# Patient Record
Sex: Female | Born: 1965 | Race: Black or African American | Hispanic: No | Marital: Married | Smoking: Never smoker
Health system: Southern US, Community
[De-identification: ages and names within clinical notes are randomized; demographics above are authoritative.]

## PROBLEM LIST (undated history)

## (undated) DIAGNOSIS — I4892 Unspecified atrial flutter: Secondary | ICD-10-CM

## (undated) DIAGNOSIS — E7800 Pure hypercholesterolemia, unspecified: Secondary | ICD-10-CM

## (undated) DIAGNOSIS — F419 Anxiety disorder, unspecified: Secondary | ICD-10-CM

## (undated) HISTORY — DX: Anxiety disorder, unspecified: F41.9

## (undated) HISTORY — PX: TUBAL LIGATION: SHX77

---

## 1997-08-13 ENCOUNTER — Observation Stay (HOSPITAL_COMMUNITY): Admission: RE | Admit: 1997-08-13 | Discharge: 1997-08-14 | Payer: Self-pay | Admitting: *Deleted

## 1997-10-29 ENCOUNTER — Inpatient Hospital Stay (HOSPITAL_COMMUNITY): Admission: AD | Admit: 1997-10-29 | Discharge: 1997-10-31 | Payer: Self-pay | Admitting: *Deleted

## 1997-11-02 ENCOUNTER — Encounter (HOSPITAL_COMMUNITY): Admission: RE | Admit: 1997-11-02 | Discharge: 1997-11-25 | Payer: Self-pay | Admitting: *Deleted

## 1997-11-24 ENCOUNTER — Inpatient Hospital Stay (HOSPITAL_COMMUNITY): Admission: AD | Admit: 1997-11-24 | Discharge: 1997-11-26 | Payer: Self-pay | Admitting: *Deleted

## 1997-12-23 ENCOUNTER — Other Ambulatory Visit: Admission: RE | Admit: 1997-12-23 | Discharge: 1997-12-23 | Payer: Self-pay | Admitting: *Deleted

## 1998-01-08 ENCOUNTER — Ambulatory Visit (HOSPITAL_COMMUNITY): Admission: RE | Admit: 1998-01-08 | Discharge: 1998-01-08 | Payer: Self-pay | Admitting: *Deleted

## 1999-04-13 ENCOUNTER — Other Ambulatory Visit: Admission: RE | Admit: 1999-04-13 | Discharge: 1999-04-13 | Payer: Self-pay | Admitting: *Deleted

## 2000-08-03 ENCOUNTER — Encounter: Admission: RE | Admit: 2000-08-03 | Discharge: 2000-08-03 | Payer: Self-pay

## 2000-08-07 ENCOUNTER — Other Ambulatory Visit: Admission: RE | Admit: 2000-08-07 | Discharge: 2000-08-07 | Payer: Self-pay | Admitting: *Deleted

## 2000-08-10 ENCOUNTER — Ambulatory Visit (HOSPITAL_COMMUNITY): Admission: RE | Admit: 2000-08-10 | Discharge: 2000-08-10 | Payer: Self-pay

## 2000-11-13 ENCOUNTER — Emergency Department (HOSPITAL_COMMUNITY): Admission: EM | Admit: 2000-11-13 | Discharge: 2000-11-13 | Payer: Self-pay | Admitting: Emergency Medicine

## 2001-08-07 ENCOUNTER — Emergency Department (HOSPITAL_COMMUNITY): Admission: EM | Admit: 2001-08-07 | Discharge: 2001-08-07 | Payer: Self-pay | Admitting: Emergency Medicine

## 2001-08-07 ENCOUNTER — Encounter: Payer: Self-pay | Admitting: Emergency Medicine

## 2001-08-14 ENCOUNTER — Other Ambulatory Visit: Admission: RE | Admit: 2001-08-14 | Discharge: 2001-08-14 | Payer: Self-pay | Admitting: *Deleted

## 2002-03-26 ENCOUNTER — Encounter: Admission: RE | Admit: 2002-03-26 | Discharge: 2002-03-26 | Payer: Self-pay | Admitting: Internal Medicine

## 2002-03-26 ENCOUNTER — Encounter: Payer: Self-pay | Admitting: Internal Medicine

## 2002-05-05 ENCOUNTER — Emergency Department (HOSPITAL_COMMUNITY): Admission: EM | Admit: 2002-05-05 | Discharge: 2002-05-05 | Payer: Self-pay | Admitting: Emergency Medicine

## 2002-05-05 ENCOUNTER — Encounter: Payer: Self-pay | Admitting: Emergency Medicine

## 2002-05-28 ENCOUNTER — Encounter: Admission: RE | Admit: 2002-05-28 | Discharge: 2002-05-28 | Payer: Self-pay | Admitting: Gastroenterology

## 2002-05-28 ENCOUNTER — Encounter: Payer: Self-pay | Admitting: Gastroenterology

## 2002-08-18 ENCOUNTER — Other Ambulatory Visit: Admission: RE | Admit: 2002-08-18 | Discharge: 2002-08-18 | Payer: Self-pay | Admitting: *Deleted

## 2002-09-01 ENCOUNTER — Ambulatory Visit (HOSPITAL_COMMUNITY): Admission: RE | Admit: 2002-09-01 | Discharge: 2002-09-01 | Payer: Self-pay | Admitting: Gastroenterology

## 2002-10-10 ENCOUNTER — Emergency Department (HOSPITAL_COMMUNITY): Admission: EM | Admit: 2002-10-10 | Discharge: 2002-10-10 | Payer: Self-pay

## 2003-01-22 ENCOUNTER — Encounter: Payer: Self-pay | Admitting: Surgery

## 2003-01-22 ENCOUNTER — Encounter: Admission: RE | Admit: 2003-01-22 | Discharge: 2003-01-22 | Payer: Self-pay | Admitting: Surgery

## 2003-02-17 ENCOUNTER — Ambulatory Visit (HOSPITAL_COMMUNITY): Admission: RE | Admit: 2003-02-17 | Discharge: 2003-02-17 | Payer: Self-pay | Admitting: Gastroenterology

## 2004-09-01 ENCOUNTER — Encounter: Admission: RE | Admit: 2004-09-01 | Discharge: 2004-09-01 | Payer: Self-pay | Admitting: General Surgery

## 2004-11-30 ENCOUNTER — Encounter: Admission: RE | Admit: 2004-11-30 | Discharge: 2004-11-30 | Payer: Self-pay | Admitting: General Surgery

## 2006-04-17 ENCOUNTER — Encounter: Admission: RE | Admit: 2006-04-17 | Discharge: 2006-04-17 | Payer: Self-pay | Admitting: General Surgery

## 2007-02-20 ENCOUNTER — Inpatient Hospital Stay (HOSPITAL_COMMUNITY): Admission: EM | Admit: 2007-02-20 | Discharge: 2007-02-21 | Payer: Self-pay | Admitting: Emergency Medicine

## 2007-03-28 ENCOUNTER — Ambulatory Visit (HOSPITAL_COMMUNITY): Admission: RE | Admit: 2007-03-28 | Discharge: 2007-03-28 | Payer: Self-pay | Admitting: Gastroenterology

## 2007-04-23 ENCOUNTER — Encounter: Admission: RE | Admit: 2007-04-23 | Discharge: 2007-04-23 | Payer: Self-pay | Admitting: *Deleted

## 2008-05-07 ENCOUNTER — Emergency Department (HOSPITAL_COMMUNITY): Admission: EM | Admit: 2008-05-07 | Discharge: 2008-05-07 | Payer: Self-pay | Admitting: Family Medicine

## 2008-10-08 ENCOUNTER — Encounter: Admission: RE | Admit: 2008-10-08 | Discharge: 2008-10-08 | Payer: Self-pay | Admitting: Family Medicine

## 2010-06-12 ENCOUNTER — Encounter: Payer: Self-pay | Admitting: *Deleted

## 2010-06-12 ENCOUNTER — Encounter: Payer: Self-pay | Admitting: General Surgery

## 2010-06-27 ENCOUNTER — Other Ambulatory Visit: Payer: Self-pay | Admitting: Family Medicine

## 2010-06-27 DIAGNOSIS — R928 Other abnormal and inconclusive findings on diagnostic imaging of breast: Secondary | ICD-10-CM

## 2010-06-28 ENCOUNTER — Other Ambulatory Visit: Payer: Self-pay | Admitting: Family Medicine

## 2010-06-28 DIAGNOSIS — R928 Other abnormal and inconclusive findings on diagnostic imaging of breast: Secondary | ICD-10-CM

## 2010-06-30 ENCOUNTER — Other Ambulatory Visit: Payer: Self-pay

## 2010-10-04 NOTE — Op Note (Signed)
Sherry Kline, Sherry Kline            ACCOUNT NO.:  [id number]   MEDICAL RECORD NO.:  [id number]          PATIENT TYPE:  AMB   LOCATION:  ENDO                         FACILITY:  [facility]   PHYSICIAN:  Shirley Friar, MDDATE OF BIRTH:  1965/11/10   DATE OF PROCEDURE:  03/28/2007  DATE OF DISCHARGE:  03/28/2007                               OPERATIVE REPORT   INDICATIONS:  Chest pain.   FINDINGS:  The means to score acid reflux analysis.  On day #1, total  score equals 15.1 with normal being less than 14.72.  Total score on day  #2,  13.1 with normal being less than 14.72.   IMPRESSION:  Chest pain that she recorded does correlate with her pH  being less than 4.  The majority of her chest pain episodes occurred  while  her esophageal pH was in the acidic or less than for range.  The  patient needs to do lifestyle modifications for reflux as previously  discussed in the office.  She should start back on her daily proton pump  inhibitor as previously recommended.  She will follow up with me in 3  weeks.      Shirley Friar, MD  Electronically Signed     VCS/MEDQ  D:  04/02/2007  T:  04/03/2007  Job:  [id number]

## 2010-10-04 NOTE — Discharge Summary (Signed)
Sherry Kline, WILHITE NO.:  [id number]   MEDICAL RECORD NO.:  [id number]          PATIENT TYPE:  INP   LOCATION:  6529                         FACILITY:  [facility]   PHYSICIAN:  Georgann Housekeeper, MD      DATE OF BIRTH:  [birth year]   DATE OF ADMISSION:  02/20/2007  DATE OF DISCHARGE:  02/21/2007                               DISCHARGE SUMMARY   DISCHARGE DIAGNOSES:  1. Chest pain, atypical.  2. Gastroesophageal reflux disease.  3. Anxiety disorder.  4. Dyslipidemia.   DISCHARGE MEDICATIONS:  1. Protonix 40 mg daily.  2. Zocor 20 mg daily.   PROCEDURES:  Ultrasound of the abdomen negative for gallbladder disease  or gallstones.  Cardiac consultation with Dr. Donato Schultz and lab work,  cardiac markers negative.  Blood chemistry is negative.  Lipase was  mildly elevated at 117.  Echocardiogram of the heart was normal.   HOSPITAL COURSE:  A 45 year old female with history of anxiety and  dyslipidemia and history of remote peptic ulcer disease admitted with  typical chest discomfort, admitted to telemetry, ruled out for MI.  The  patient had cardiac markers negative.  Lab work was all unremarkable.  A  2D echocardiogram was done which was normal.  The patient had more  epigastric discomfort, ultrasound obtained because of her mildly  elevated lipase.  She clinically did well.  Ultrasound of the abdomen  was negative for any gallbladder disease.  As far as the patient was put  on Protonix 40 mg and follow up outpatient.  As far as her dyslipidemia  was started on Zocor 20 mg and lipid profile will be checked again as an  outpatient.  If needed consider outpatient stress test as per  cardiology.  See her back in the office in 1 to 2 weeks.      Georgann Housekeeper, MD  Electronically Signed     KH/MEDQ  D:  04/12/2007  T:  04/12/2007  Job:  [id number]

## 2010-10-04 NOTE — Consult Note (Signed)
NAMEHIKARI, TRIPP NO.:  [id number]   MEDICAL RECORD NO.:  [id number]          PATIENT TYPE:  INP   LOCATION:  1827                         FACILITY:  [facility]   PHYSICIAN:  Jake Bathe, MD      DATE OF BIRTH:  1966-03-15   DATE OF CONSULTATION:  02/20/2007  DATE OF DISCHARGE:                                 CONSULTATION   REFERRING PHYSICIAN:  Dr. Georgann Housekeeper.   REASON FOR CONSULTATION:  Ms. Sorn is being seen at the request of  Dr. Donette Larry for the evaluation of chest pain.   HISTORY OF PRESENT ILLNESS:  Ms. Westbay is a 45 year old female with no  known prior coronary artery disease, who complains of intermittent yet  progressive substernal chest pain/pressure with radiation to her left  shoulder, left arm accompanied by nausea and diaphoresis.  The pain had  started on Monday, and at its worst intensity was 5 to 6/10.  She does  have constant epigastric discomfort once intense pain had resolved.   Years ago, she had a stress test done by Dr. Katrinka Blazing secondary to panic  attacks, which was normal.  She does have a strong family history of  coronary artery disease and hyperlipidemia.  Currently is chest pain  free.  Denies any palpitations, syncope, edema, orthopnea, PND.   PAST MEDICAL HISTORY:  1. Hyperlipidemia.  2. Tubal ligation.  3. Cervical anchorage in the past.   ALLERGIES:  She has had an allergic reaction in the past to a component  of the GI cocktail; she is not sure what component.   MEDICATIONS:  None currently.   FAMILY HISTORY:  Her mother is alive, has history of rheumatic heart  disease.  Her dad has died secondary to pancreatic complications.  She  does have a strong early family history of coronary artery disease.   SOCIAL HISTORY:  Occasionally drinks alcohol in moderation, is a  nonsmoker and no illicit drug use.   REVIEW OF SYSTEMS:  Unless as specified above, all other 12 review of  systems negative.   PHYSICAL EXAM:   VITAL SIGNS:  Blood pressure 111/85, pulse 80,  respirations 20, temperature 98.1.  GENERAL:  Alert and oriented x3, in no acute distress, lying in bed  comfortably, with her family.  Echo technician here performing  echocardiogram.  HEENT:  Eyes:  Well-perfused conjunctivae, anicteric sclerae.  EOMI.  NECK:  Supple.  No lymphadenopathy, no thyromegaly.  CARDIOVASCULAR:  Regular rate and rhythm.  No murmurs, rubs, or gallops.  No JVD, no carotid bruits.  LUNGS:  Clear to auscultation bilaterally.  No wheezes, no rales.  Normal respiratory effort.  ABDOMEN:  Soft. Nontender.  Normoactive bowel sounds.  No  hepatosplenomegaly appreciated.  EXTREMITIES:  No clubbing, cyanosis, or edema.  2+ distal pulses.  NEUROLOGIC:  Nonfocal.  No tremors.  SKIN:  Warm, dry, and intact.   DATA:  ECG here demonstrates sinus rhythm, rate 83, with sinus  arrhythmia.  No other abnormalities noted.  Prior ECG dated May 05, 2002, is normal.  Chest x-ray personally reviewed shows no disease.   LABORATORY  WORK:  Sodium 138, potassium 3.7, BUN 16, creatinine 0.9,  glucose 98.  Hemoglobin 14.6, hematocrit 43, D-dimer less than 0.22.  INR 1.0, PTT 29.  Two sets of point of care cardiac enzymes have been  normal.   ASSESSMENT AND PLAN:  A 45 year old female with hyperlipidemia and  family history of coronary artery disease with chest pain concerning for  unstable angina.  1. Unstable angina - we will go ahead and place her on telemetry.      Continue to cycle cardiac enzymes.  We will utilize Lovenox b.i.d.      We will review echocardiogram already performed.  EKG reassuring.      Currently chest pain free.  Oxygen, nitroglycerin, beta blocker.      We will check fasting lipid profile.  2. Hyperlipidemia - check fasting lipid profile and liver function      tests, currently diet controlled.  3. Epigastric discomfort - we will check amylase and lipase.  Wonder      if her pain may be secondary to  gastroesophageal reflux disease or      peptic ulcer disease.  May benefit from proton pump inhibitor.  4. Rare alcohol use.  5. If cardiac enzymes are positive or if she has any other high-risk      features, we will plan for cardiac catheterization.  However, if      she continues to be chest pain free and cardiac enzymes and EKG are      normal, we will plan on nuclear stress test for further risk      stratification.      Jake Bathe, MD  Electronically Signed     MCS/MEDQ  D:  02/20/2007  T:  02/20/2007  Job:  [id number]   cc:   Georgann Housekeeper, MD

## 2010-10-04 NOTE — H&P (Signed)
NAMECHRISTAIN, MCRANEY NO.:  [id number]   MEDICAL RECORD NO.:  [id number]          PATIENT TYPE:  INP   LOCATION:  1827                         FACILITY:  [facility]   PHYSICIAN:  Ramiro Harvest, MD    DATE OF BIRTH:  Apr 10, 1966   DATE OF ADMISSION:  02/20/2007  DATE OF DISCHARGE:                              HISTORY & PHYSICAL   HISTORY OF PRESENT ILLNESS:  Sherry Kline is a 45 year old African-  American female with past medical history of hyperlipidemia, anxiety,  family history history of coronary artery disease, status post MI in the  patient's mother in her early 19s.  The patient presents to the ED with  a 3-day history of worsening substernal left-sided chest pain with  radiation to the left upper extremity, occurring both at rest and on  exertion.  The patient describes the pain as a heaviness which is  constant in nature and radiating to the right upper extremity.  Associated symptoms include diaphoresis, shortness of breath, orthopnea.  The patient denies any palpitations.  No syncope, no heartburn.  The  patient states the chest pain is now 5 out of 10.  The patient stated  that she took aspirin last night which had some relief in her symptoms  and took 1 this morning prior to coming to the ED.  The patient's chest  pain occurred again, this morning.  It has been constant in nature and  occurred while at rest, at work and while on the phone.   ALLERGIES:  QUESTIONABLE ALLERGIES TO A GI COCKTAIL.   PAST MEDICAL HISTORY:  1. Anxiety.  2. Hyperlipidemia.  3. History of ulcer, approximately 9 years ago.   PAST SURGICAL HISTORY:  Status post tubal ligation, status post cervical  collage.   MEDICATIONS:  None.   FAMILY HISTORY:  Mother alive at age 42 with a history of mitral valve  prolapse, secondary to rheumatic heart disease, status post myocardial  infarction times 2 in her early 97s, hypertension, hyperlipemia, thyroid  disease, lupus, status  post bladder cancer.  Father deceased at age 29,  secondary to pancreatic disease.  The patient has 1 living sister, 1  brother age 39, and 1 sister who is decreased from a MVA at age 14.   SOCIAL HISTORY:  The patient is divorced.  Lives in [place]; no  tobacco.  Occasional alcohol use.  No cocaine or IV drug use.   REVIEW OF SYSTEMS:  GENERAL:  No fever, no chills.  No recent travel.  No recent weight loss.  HEENT:  No sore throat.  No nose bleeds.  RESPIRATORY-WISE:  Positive shortness of breath.  No cough.  No  hemoptysis.  No wheezing.  GI:  Positive for nausea.  No vomiting.  No  diarrhea, no constipation or dysphagia.  GU:  No dysuria, no hematuria.  PSYCHOLOGICAL:  No depression.  No suicidal ideation.  No homicidal  ideation.  No anxiety and no polyuria.  No polydipsia.  NEUROLOGICAL:  No weakness, no numbness.  No headache, no diplopia.   PHYSICAL EXAMINATION:  VITAL SIGNS:  Temperature 98.4, blood pressure  137/94,  pulse of 90, respiratory rate 26, saturating 100% on 2 liters  nasal canula.  GENERAL:  The patient is well developed, well nourished  African-American female laying on gurney.  HEENT:  Normocephalic, atraumatic.  Pupils equal, round, reactive to  light.  Extraocular movements intact.  Sclerae is anicteric.  NECK:  Supple.  Oropharynx clear.  Moist.  No lesions. No exudates.  RESPIRATORY:  Lungs are clear to auscultation bilaterally.  No wheezes.  No rhonchi.  CARDIOVASCULAR:  Heart is regular rate and rhythm.  No murmurs, rubs or  gallops.  Questionable JVD.  ABDOMEN:  Soft, nontender, nondistended.  Positive bowel sounds.  No  hepatosplenomegaly.  EXTREMITIES:  No clubbing, cyanosis or edema.  NEUROLOGICAL:  The patient is alert and oriented x3.  Cranial nerves II-  XII are grossly intact.  Cerebellum was intact.  Gait not tested.   LABS:  Chest x-ray is no acute cardiopulmonary disease.  High stat has a  sodium of 138, potassium 3.8, chloride of 108, BUN  16, glucose 98.  H&H:  Hemoglobin 14.6, hematocrit 43.6.  Point of care myoglobin 48.5.  CK-MB  less than 1.  Troponin I less than 0.05.  D-dimer was less than 0.22.  EKG:  Normal sinus rhythm.  Unchanged from prior EKG.   ASSESSMENT AND PLAN:  1. Sherry Kline is a 45 year old African-American female with a      history of hyperlipidemia, positive family history of coronary      artery disease, presenting with worsening chest pain at rest and on      exertion.  Chest pain/unstable angina.  Will admit the patient to      telemetry to rule out acute coronary syndrome.  Doubt if this is      secondary to pulmonary embolism as the D-dimer was negative.  Doubt      if this is dissection. Chest x-ray was also clear with no evidence      of cardiopulmonary disease.  I am going to go ahead and cycle some      cardiac enzymes q.8 hours x3.  Check her EKG now and in 12 hours      and in the morning.  Check her C-MET, CBC, TSH, fasting lipid      panel.  Check her guaiac, PT, PTT and INR.  Check her UA and UDS.      Check a 2-D echo to evaluate LV dysfunction.  We will maintain the      patient on oxygen to keep saturations greater than 90%, Morphine 2      mg IV q.4 hours p.r.n. chest pain.  The patient already received      325 mg of aspirin this morning.  Will continue on aspirin 81 mg      daily.  Start patient on Lopressor 5 mg IV q.5 minutes x3, then      Lopressor 25 mg p.o. q.6 hours and start her on lisinopril 2.5 mg      daily and Lipitor 10 mg daily as well as Lovenox.  We will get a      cardiac consult on this patient as well.  2. Hyperlipidemia.  Will check a fasting lipid panel.  Start the      patient on Lipitor 10 mg daily.  Prophylaxis:  Protonix 40 mg p.o.      daily for GI and Lovenox for DVT prophylaxis.   It has been a pleasure taking care of Sherry Kline.  Ramiro Harvest, MD  Electronically Signed     DT/MEDQ  D:  02/20/2007  T:  02/20/2007  Job:   [id number]

## 2010-10-04 NOTE — Op Note (Signed)
Sherry Kline, Sherry Kline            ACCOUNT NO.:  [id number]   MEDICAL RECORD NO.:  [id number]          PATIENT TYPE:  AMB   LOCATION:  ENDO                         FACILITY:  [facility]   PHYSICIAN:  Shirley Friar, MDDATE OF BIRTH:  [birth year]   DATE OF PROCEDURE:  03/28/2007  DATE OF DISCHARGE:                               OPERATIVE REPORT   UPPER ENDOSCOPY AND BRAVO CAPSULE PLACEMENT:   INDICATIONS:  Chest pain.   MEDICATIONS:  Fentanyl 100 mcg IV, Versed 8 mg IV, Phenergan 25 mg IV,  Cetacaine spray x2.   FINDINGS:  The endoscope was inserted through the oropharynx and the  esophagus was intubated.  The esophagus was normal in its entirety.  The  Z-line was regular in appearance and was noted at 30-38 cm from  incisors.  Endoscope was advanced into the stomach and down into the  distal stomach.  In the distal stomach there were scattered small  erosions and a superficial ulceration in the distal antrum/prepyloric  channel.  Retroflexion was done, which revealed a normal proximal  stomach.  Endoscope was straightened and advanced to the duodenal bulb  and second portion of the duodenum, which were both normal.  Endoscope  was withdrawn back into the stomach and one biopsy was taken of the  distal antrum for sending off for CLO testing.  The endoscope was then  withdrawn.  A Bravo capsule catheter was inserted in the usual fashion  and attached at 32 cm, which was approximately 6 cm above the GE  junction.  The endoscope was reinserted to confirm adequate placement  without any immediate complications.   ASSESSMENT:  1. Gastric erosions, status post biopsy for CLO testing.  2. Status post successful Bravo capsule placement at 32 cm from the      incisors.   PLAN:  1. Follow up on CLO-test and treat for H. pylori if positive.  2. Follow up on 48-hour pH Bravo capsule testing.  The patient is      going to remain off of proton pump inhibitors for the testing.      Shirley Friar, MD  Electronically Signed     VCS/MEDQ  D:  03/28/2007  T:  03/28/2007  Job:  [id number]   cc:   Georgann Housekeeper, MD  Fax: [phone]

## 2010-10-07 NOTE — Op Note (Signed)
   Sherry Kline, Sherry Kline NO.:  [id number]   MEDICAL RECORD NO.:  [id number]                   PATIENT TYPE:  AMB   LOCATION:  ENDO                                 FACILITY:  [facility]   PHYSICIAN:  Danise Edge, M.D.                DATE OF BIRTH:  May 17, 1966   DATE OF PROCEDURE:  02/17/2003  DATE OF DISCHARGE:  02/17/2003                                 OPERATIVE REPORT   PROCEDURE:  Esophageal manometry and ambulatory 24 hour esophageal pH study.   PROCEDURE INDICATIONS:  Ms. Alane Hanssen is a 45 year old female born  [birth year].  Ms. Villagomez has unexplained chest pain unresponsive to  antireflux therapy with proton pump inhibitor medication.  She is undergoing  esophageal manometry and 24 hour esophageal pH study off proton pump  inhibitor therapy.   ENDOSCOPIST:  Danise Edge, M.D.   PROCEDURE:  Esophageal manometry.   Lower esophageal sphincter data:  The resting lower esophageal sphincter  pressure is 35 mmHg which is normal.  There is 92% relaxation of the lower  esophageal sphincter pressure with swallow for 13.1 seconds.  Lower  esophageal sphincter function is normal.   Esophageal body function:  Esophageal body function is based on 10 wet  swallows which resulted in 100% peristaltic contractions.  Peristaltic wave  amplitude averages 172 mmHg which is normal.  Esophageal body function is  normal.   Upper esophageal sphincter function:  There is 100% relaxation of the upper  esophageal sphincter pressure with swallows and upper esophageal body and  sphincter function is coordinated.   ASSESSMENT:  Normal esophageal manometry.   A 24 hour esophageal pH study:  Upright time in reflux, (3%), recumbent time  in reflux, (0.6%), and total time in reflux, (2%), are all completely  normal.  There was no episode of reflux lasting over five minutes; the  longest reflux  episode lasted 2.1 minutes.  The patient had 70 episodes of  reflux which is  slightly above the normal of less than 50 for 24 hours.  The composite  Johnson/DeMeester score is 9.7 which is well within the normal less than  22.0.   ASSESSMENT:  Normal ambulatory esophageal pH study.                                                Danise Edge, M.D.    MJ/MEDQ  D:  03/09/2003  T:  03/09/2003  Job:  [id number]   cc:   Velora Heckler, M.D.  [address] [place]  [place] [address]  Fax: [phone]

## 2010-10-07 NOTE — Op Note (Signed)
Sherry Kline, Sherry Kline NO.:  [id number]   MEDICAL RECORD NO.:  [id number]                   PATIENT TYPE:  AMB   LOCATION:  ENDO                                 FACILITY:  [facility]   PHYSICIAN:  Danise Edge, M.D.                DATE OF BIRTH:  Nov 05, 1965   DATE OF PROCEDURE:  09/01/2002  DATE OF DISCHARGE:                                 OPERATIVE REPORT   INDICATIONS FOR PROCEDURE:  The patient is a 45 year old female born  1965/05/29.  The patient has unexplained intermittent bouts of chest  pain and a globus sensation unassociated with dysphagia or odynophagia.  She  has been evaluated by the cardiologist and has no cardiac disease to explain  her bouts of chest pain clearly.  She underwent a barium swallow/upper GI x-  ray series which was normal.  She was placed on double dose protein pump  inhibitor therapy and her symptoms did not respond significantly.  She  chronically takes Paxil CR and Xanax.  The question is whether the patient  has enough gastroesophageal reflux to explain her symptoms.   ENDOSCOPIST:  Danise Edge, M.D.   PREMEDICATION:  Versed 10 mg, Demerol 50 mg.   PROCEDURE:  After obtaining informed consent the patient was placed in the  left lateral decubitus position.  I administered intravenous Demerol and  intravenous Versed to achieve conscious sedation for the  procedure.  The  patient's blood pressure, oxygen saturation and cardiac rhythm were  monitored during the procedure and documented in the medical record.   The Olympus gastroscope was passed through the posterior hypopharynx into  the proximal esophagus without difficulty.  The hypopharynx, larynx and  vocal cords appeared normal.   Esophagoscopy:  The proximal, mid and lower segments about the esophageal  mucosa appear completely normal.   Gastroscopy:  Retroflex view of the gastric cardia and fundus was normal.  The gastric body, antrum and pylorus  appeared normal.   Duodenoscopy:  The duodenal bulb, mid duodenal and distal duodenum appear  normal.   ASSESSMENT:  Normal esophagogastroduodenoscopy.    RECOMMENDATIONS:  Consider 24 hour esophageal pH study to assess  gastroesophageal reflux. I suspect the patient does not have significant  gastroesophageal reflux and reflux is not the explanation for her bouts of  chest pain and her globus sensation.                                               Danise Edge, M.D.    MJ/MEDQ  D:  09/01/2002  T:  09/01/2002  Job:  [id number]   cc:   Lesleigh Noe, M.D.  [address]  [place]  [place] [address]  Fax: [phone]   Georgann Housekeeper, M.D.  [phone]  [place]  [place] [address]  Fax: [phone]

## 2011-02-24 LAB — POCT URINALYSIS DIP (DEVICE)
Hgb urine dipstick: NEGATIVE
Protein, ur: 30 mg/dL — AB
Specific Gravity, Urine: 1.025 (ref 1.005–1.030)
Urobilinogen, UA: 0.2 mg/dL (ref 0.0–1.0)
pH: 6.5 (ref 5.0–8.0)

## 2011-02-24 LAB — COMPREHENSIVE METABOLIC PANEL
ALT: 10 U/L (ref 0–35)
Chloride: 105 mEq/L (ref 96–112)
GFR calc non Af Amer: >60 mL/min (ref >60)
Glucose, Bld: 103 mg/dL — ABNORMAL HIGH (ref 70–99)
Potassium: 3.7 mEq/L (ref 3.5–5.1)
Sodium: 138 mEq/L (ref 135–145)
Total Protein: 7.1 g/dL (ref 6.0–8.3)

## 2011-02-24 LAB — CBC
RBC: 4.6 MIL/uL (ref 3.87–5.11)
RDW: 13 % (ref 11.5–15.5)

## 2011-02-24 LAB — DIFFERENTIAL
Eosinophils Absolute: 0 10*3/uL (ref 0.0–0.7)
Monocytes Absolute: 0.4 10*3/uL (ref 0.1–1.0)
Monocytes Relative: 9 % (ref 3–12)
Neutro Abs: 2.2 10*3/uL (ref 1.7–7.7)

## 2011-02-24 LAB — POCT H PYLORI SCREEN: H. PYLORI SCREEN, POC: NEGATIVE

## 2011-02-24 LAB — POCT PREGNANCY, URINE: Preg Test, Ur: NEGATIVE

## 2011-02-28 LAB — CLOTEST (H. PYLORI), BIOPSY: Helicobacter screen: NEGATIVE

## 2011-03-02 LAB — I-STAT 8, (EC8 V) (CONVERTED LAB)
Acid-base deficit: 1
Glucose, Bld: 98
HCT: 43
Hemoglobin: 14.6
Potassium: 3.7
TCO2: 24
pCO2, Ven: 34.9 — ABNORMAL LOW
pH, Ven: 7.429 — ABNORMAL HIGH

## 2011-03-02 LAB — CBC
HCT: 35.8 — ABNORMAL LOW
HCT: 36.7
Hemoglobin: 12
MCHC: 33.5
MCV: 82.3
Platelets: 354
RBC: 4.35
RDW: 13.4
WBC: 4.3

## 2011-03-02 LAB — POCT CARDIAC MARKERS
Myoglobin, poc: 48.5
Operator id: 285841

## 2011-03-02 LAB — COMPREHENSIVE METABOLIC PANEL
ALT: 8
AST: 11
Albumin: 3.3 — ABNORMAL LOW
Alkaline Phosphatase: 47
CO2: 25
Calcium: 8.8
Chloride: 106
Creatinine, Ser: 0.83
GFR calc Af Amer: >60
Potassium: 3.6
Total Bilirubin: 0.6
Total Protein: 6.6

## 2011-03-02 LAB — LIPID PANEL
Cholesterol: 256 — ABNORMAL HIGH
HDL: 27 — ABNORMAL LOW
Total CHOL/HDL Ratio: 9.5
VLDL: 27

## 2011-03-02 LAB — URINALYSIS, ROUTINE W REFLEX MICROSCOPIC
Bilirubin Urine: NEGATIVE
Ketones, ur: 15 — AB
Nitrite: NEGATIVE
Protein, ur: NEGATIVE

## 2011-03-02 LAB — LIPASE, BLOOD: Lipase: 117 — ABNORMAL HIGH

## 2011-03-02 LAB — CARDIAC PANEL(CRET KIN+CKTOT+MB+TROPI)
CK, MB: 0.4
Relative Index: INVALID
Relative Index: INVALID
Troponin I: 0.01

## 2011-03-02 LAB — BASIC METABOLIC PANEL
BUN: 14
CO2: 27
Calcium: 8.7
Chloride: 105
Creatinine, Ser: 0.83
GFR calc Af Amer: >60

## 2011-03-02 LAB — TSH: TSH: 1.031

## 2011-03-02 LAB — TROPONIN I: Troponin I: 0.01

## 2011-03-02 LAB — APTT: aPTT: 29

## 2011-03-02 LAB — RAPID URINE DRUG SCREEN, HOSP PERFORMED
Benzodiazepines: NOT DETECTED
Tetrahydrocannabinol: NOT DETECTED

## 2011-03-02 LAB — CK TOTAL AND CKMB (NOT AT ARMC): CK, MB: 0.4

## 2011-08-15 ENCOUNTER — Other Ambulatory Visit: Payer: Self-pay | Admitting: Family Medicine

## 2011-08-15 DIAGNOSIS — N926 Irregular menstruation, unspecified: Secondary | ICD-10-CM

## 2011-08-15 DIAGNOSIS — D259 Leiomyoma of uterus, unspecified: Secondary | ICD-10-CM

## 2011-08-16 ENCOUNTER — Other Ambulatory Visit: Payer: Self-pay

## 2011-08-18 ENCOUNTER — Ambulatory Visit
Admission: RE | Admit: 2011-08-18 | Discharge: 2011-08-18 | Disposition: A | Discharge status: Home / Self Care | Payer: PRIVATE HEALTH INSURANCE | Source: Ambulatory Visit | Attending: Family Medicine | Admitting: Family Medicine

## 2011-08-18 ENCOUNTER — Ambulatory Visit
Admission: RE | Admit: 2011-08-18 | Discharge: 2011-08-18 | Disposition: A | Discharge status: Home / Self Care | Payer: Self-pay | Source: Ambulatory Visit | Attending: Family Medicine | Admitting: Family Medicine

## 2011-08-18 DIAGNOSIS — D259 Leiomyoma of uterus, unspecified: Secondary | ICD-10-CM

## 2011-08-18 DIAGNOSIS — N926 Irregular menstruation, unspecified: Secondary | ICD-10-CM

## 2013-10-30 ENCOUNTER — Ambulatory Visit
Admission: RE | Admit: 2013-10-30 | Discharge: 2013-10-30 | Disposition: A | Discharge status: Home / Self Care | Payer: PRIVATE HEALTH INSURANCE | Source: Ambulatory Visit

## 2013-10-30 ENCOUNTER — Encounter (INDEPENDENT_AMBULATORY_CARE_PROVIDER_SITE_OTHER): Payer: Self-pay

## 2013-10-30 ENCOUNTER — Other Ambulatory Visit: Payer: Self-pay

## 2013-10-30 DIAGNOSIS — Z1231 Encounter for screening mammogram for malignant neoplasm of breast: Secondary | ICD-10-CM

## 2014-04-15 ENCOUNTER — Ambulatory Visit (INDEPENDENT_AMBULATORY_CARE_PROVIDER_SITE_OTHER): Payer: Self-pay | Admitting: Emergency Medicine

## 2014-04-15 VITALS — BP 130/92 | HR 89 | Temp 98.7°F | Resp 16 | Ht 67.0 in | Wt 155.0 lb

## 2014-04-15 DIAGNOSIS — K5900 Constipation, unspecified: Secondary | ICD-10-CM

## 2014-04-15 DIAGNOSIS — N898 Other specified noninflammatory disorders of vagina: Secondary | ICD-10-CM

## 2014-04-15 LAB — POCT UA - MICROSCOPIC ONLY
CASTS, UR, LPF, POC: NEGATIVE
CRYSTALS, UR, HPF, POC: NEGATIVE
Mucus, UA: NEGATIVE
Yeast, UA: NEGATIVE

## 2014-04-15 LAB — POCT URINALYSIS DIPSTICK
Bilirubin, UA: NEGATIVE
Glucose, UA: NEGATIVE
Ketones, UA: NEGATIVE
Leukocytes, UA: NEGATIVE
NITRITE UA: NEGATIVE
PH UA: 7
Protein, UA: NEGATIVE
RBC UA: NEGATIVE
Spec Grav, UA: 1.025
UROBILINOGEN UA: 1

## 2014-04-15 LAB — POCT WET PREP WITH KOH
Clue Cells Wet Prep HPF POC: NEGATIVE
KOH PREP POC: NEGATIVE
RBC WET PREP PER HPF POC: NEGATIVE
Trichomonas, UA: NEGATIVE
YEAST WET PREP PER HPF POC: NEGATIVE

## 2014-04-15 NOTE — Patient Instructions (Signed)

## 2014-04-15 NOTE — Progress Notes (Signed)
[facility] [address] [place] [address] [id number] [phone]  Date:  04/15/2014   Name:  Sherry Kline   DOB:  1965/11/17   MRN:  [id number]  PCP:  No PCP Per Patient    Chief Complaint: Vaginal Discharge and Abdominal Pain   History of Present Illness:  Sherry Kline is a 48 y.o. very pleasant female patient who presents with the following:  G3P3. LMP 6 months.  Has a new sexual partner and has a vaginal discharge for a week No dysuria, urgency or frequency.  No dyspareunia. Has some bilateral pelvic shooting pain that is intermittent. No GI or GYN or GU symptoms. Had a BTL No fever or chills. No improvement with over the counter medications or other home remedies.  Denies other complaint or health concern today.   There are no active problems to display for this patient.   Past Medical History  Diagnosis Date  . Anxiety     Past Surgical History  Procedure Laterality Date  . Tubal ligation      History  Substance Use Topics  . Smoking status: Never Smoker   . Smokeless tobacco: Not on file  . Alcohol Use: 0.0 oz/week    0 Not specified per week    Family History  Problem Relation Age of Onset  . Hypertension Mother     No Known Allergies  Medication list has been reviewed and updated.  No current outpatient prescriptions on file prior to visit.   No current facility-administered medications on file prior to visit.    Review of Systems:  As per HPI, otherwise negative.    Physical Examination: Filed Vitals:   04/15/14 1721  BP: 130/92  Pulse: 89  Temp: 98.7 F (37.1 C)  Resp: 16   Filed Vitals:   04/15/14 1721  Height: 5\' 7"  (1.702 m)  Weight: 155 lb (70.308 kg)   Body mass index is 24.27 kg/(m^2). Ideal Body Weight: Weight in (lb) to have BMI = 25: 159.3  GEN: WDWN, NAD, Non-toxic, A & O x 3 HEENT: Atraumatic, Normocephalic. Neck supple. No masses, No LAD. Ears and Nose: No external deformity. CV:  RRR, No M/G/R. No JVD. No thrill. No extra heart sounds. PULM: CTA B, no wheezes, crackles, rhonchi. No retractions. No resp. distress. No accessory muscle use. ABD: S, NT, ND, +BS. No rebound. No HSM. EXTR: No c/c/e NEURO Normal gait.  PSYCH: Normally interactive. Conversant. Not depressed or anxious appearing.  Calm demeanor.    Assessment and Plan: Vaginal discharge Constipation   Signed,  Ellison Carwin, MD   Results for orders placed or performed in visit on 04/15/14  POCT Wet Prep with KOH  Result Value Ref Range   Trichomonas, UA Negative    Clue Cells Wet Prep HPF POC neg    Epithelial Wet Prep HPF POC 4-6    Yeast Wet Prep HPF POC neg    Bacteria Wet Prep HPF POC small    RBC Wet Prep HPF POC neg    WBC Wet Prep HPF POC 1-4    KOH Prep POC Negative   POCT urinalysis dipstick  Result Value Ref Range   Color, UA yellow    Clarity, UA clear    Glucose, UA neg    Bilirubin, UA neg    Ketones, UA neg    Spec Grav, UA 1.025    Blood, UA neg    pH, UA 7.0    Protein, UA  neg    Urobilinogen, UA 1.0    Nitrite, UA neg    Leukocytes, UA Negative   POCT UA - Microscopic Only  Result Value Ref Range   WBC, Ur, HPF, POC 0-2    RBC, urine, microscopic 1-3    Bacteria, U Microscopic 1+    Mucus, UA neg    Epithelial cells, urine per micros 2-4    Crystals, Ur, HPF, POC neg    Casts, Ur, LPF, POC neg    Yeast, UA neg

## 2014-04-16 LAB — GC/CHLAMYDIA PROBE AMP
CT PROBE, AMP APTIMA: NEGATIVE
GC PROBE AMP APTIMA: NEGATIVE

## 2014-07-01 ENCOUNTER — Other Ambulatory Visit: Payer: Self-pay | Admitting: Family Medicine

## 2014-07-01 DIAGNOSIS — N644 Mastodynia: Secondary | ICD-10-CM

## 2014-07-02 ENCOUNTER — Ambulatory Visit
Admission: RE | Admit: 2014-07-02 | Discharge: 2014-07-02 | Disposition: A | Discharge status: Home / Self Care | Payer: PRIVATE HEALTH INSURANCE | Source: Ambulatory Visit | Attending: Family Medicine | Admitting: Family Medicine

## 2014-07-02 DIAGNOSIS — N644 Mastodynia: Secondary | ICD-10-CM

## 2014-12-15 ENCOUNTER — Other Ambulatory Visit: Payer: Self-pay | Admitting: Family Medicine

## 2014-12-15 DIAGNOSIS — R102 Pelvic and perineal pain unspecified side: Secondary | ICD-10-CM

## 2014-12-18 ENCOUNTER — Ambulatory Visit
Admission: RE | Admit: 2014-12-18 | Discharge: 2014-12-18 | Disposition: A | Discharge status: Home / Self Care | Payer: PRIVATE HEALTH INSURANCE | Source: Ambulatory Visit | Attending: Family Medicine | Admitting: Family Medicine

## 2014-12-18 DIAGNOSIS — R102 Pelvic and perineal pain: Secondary | ICD-10-CM

## 2015-01-22 ENCOUNTER — Emergency Department (HOSPITAL_COMMUNITY): Payer: PRIVATE HEALTH INSURANCE

## 2015-01-22 ENCOUNTER — Encounter (HOSPITAL_COMMUNITY): Payer: Self-pay | Admitting: *Deleted

## 2015-01-22 ENCOUNTER — Emergency Department (HOSPITAL_COMMUNITY)
Admission: EM | Admit: 2015-01-22 | Discharge: 2015-01-22 | Disposition: A | Discharge status: Home / Self Care | Payer: PRIVATE HEALTH INSURANCE | Attending: Emergency Medicine | Admitting: Emergency Medicine

## 2015-01-22 DIAGNOSIS — Z86018 Personal history of other benign neoplasm: Secondary | ICD-10-CM | POA: Diagnosis not present

## 2015-01-22 DIAGNOSIS — Z79899 Other long term (current) drug therapy: Secondary | ICD-10-CM | POA: Insufficient documentation

## 2015-01-22 DIAGNOSIS — R110 Nausea: Secondary | ICD-10-CM | POA: Diagnosis not present

## 2015-01-22 DIAGNOSIS — H53149 Visual discomfort, unspecified: Secondary | ICD-10-CM | POA: Insufficient documentation

## 2015-01-22 DIAGNOSIS — R51 Headache: Secondary | ICD-10-CM | POA: Insufficient documentation

## 2015-01-22 DIAGNOSIS — F419 Anxiety disorder, unspecified: Secondary | ICD-10-CM | POA: Insufficient documentation

## 2015-01-22 DIAGNOSIS — R519 Headache, unspecified: Secondary | ICD-10-CM

## 2015-01-22 DIAGNOSIS — M542 Cervicalgia: Secondary | ICD-10-CM | POA: Diagnosis not present

## 2015-01-22 DIAGNOSIS — R6883 Chills (without fever): Secondary | ICD-10-CM | POA: Diagnosis not present

## 2015-01-22 DIAGNOSIS — R5383 Other fatigue: Secondary | ICD-10-CM | POA: Insufficient documentation

## 2015-01-22 LAB — CBC WITH DIFFERENTIAL/PLATELET
BASOS PCT: 1 % (ref 0–1)
Basophils Absolute: 0 10*3/uL (ref 0.0–0.1)
Eosinophils Absolute: 0.1 10*3/uL (ref 0.0–0.7)
Eosinophils Relative: 2 % (ref 0–5)
HEMATOCRIT: 36.8 % (ref 36.0–46.0)
HEMOGLOBIN: 12.2 g/dL (ref 12.0–15.0)
LYMPHS ABS: 1.5 10*3/uL (ref 0.7–4.0)
LYMPHS PCT: 38 % (ref 12–46)
MCH: 27.3 pg (ref 26.0–34.0)
MCHC: 33.2 g/dL (ref 30.0–36.0)
MCV: 82.3 fL (ref 78.0–100.0)
MONO ABS: 0.4 10*3/uL (ref 0.1–1.0)
MONOS PCT: 11 % (ref 3–12)
NEUTROS PCT: 48 % (ref 43–77)
Neutro Abs: 1.9 10*3/uL (ref 1.7–7.7)
Platelets: 333 10*3/uL (ref 150–400)
RBC: 4.47 MIL/uL (ref 3.87–5.11)
RDW: 13.2 % (ref 11.5–15.5)
WBC: 3.9 10*3/uL — ABNORMAL LOW (ref 4.0–10.5)

## 2015-01-22 LAB — BASIC METABOLIC PANEL
Anion gap: 7 (ref 5–15)
BUN: 13 mg/dL (ref 6–20)
CHLORIDE: 103 mmol/L (ref 101–111)
CO2: 26 mmol/L (ref 22–32)
CREATININE: 0.96 mg/dL (ref 0.44–1.00)
Calcium: 8.9 mg/dL (ref 8.9–10.3)
GFR calc Af Amer: >60 mL/min (ref >60)
GFR calc non Af Amer: >60 mL/min (ref >60)
GLUCOSE: 102 mg/dL — AB (ref 65–99)
Potassium: 3.8 mmol/L (ref 3.5–5.1)
Sodium: 136 mmol/L (ref 135–145)

## 2015-01-22 MED ORDER — OXYCODONE-ACETAMINOPHEN 5-325 MG PO TABS
1.0000 | ORAL_TABLET | Freq: Once | ORAL | Status: AC
  Administered 2015-01-22: 1 via ORAL

## 2015-01-22 MED ORDER — DIPHENHYDRAMINE HCL 50 MG/ML IJ SOLN
25.0000 mg | Freq: Once | INTRAMUSCULAR | Status: AC
  Administered 2015-01-22: 25 mg via INTRAVENOUS
  Filled 2015-01-22: qty 1

## 2015-01-22 MED ORDER — SODIUM CHLORIDE 0.9 % IV BOLUS (SEPSIS)
1000.0000 mL | Freq: Once | INTRAVENOUS | Status: AC
  Administered 2015-01-22: 1000 mL via INTRAVENOUS

## 2015-01-22 MED ORDER — BUTALBITAL-APAP-CAFFEINE 50-325-40 MG PO TABS: 1.0000 | ORAL_TABLET | Freq: Four times a day (QID) | ORAL | Status: AC | PRN

## 2015-01-22 MED ORDER — OXYCODONE-ACETAMINOPHEN 5-325 MG PO TABS
ORAL_TABLET | ORAL | Status: AC
  Filled 2015-01-22: qty 1

## 2015-01-22 MED ORDER — METOCLOPRAMIDE HCL 5 MG/ML IJ SOLN
10.0000 mg | Freq: Once | INTRAMUSCULAR | Status: AC
  Administered 2015-01-22: 10 mg via INTRAVENOUS
  Filled 2015-01-22: qty 2

## 2015-01-22 NOTE — ED Provider Notes (Signed)
CSN: [id number]     Arrival date & time 01/22/15  1319 History   First MD Initiated Contact with Patient 01/22/15 1421     Chief Complaint  Patient presents with  . Headache  . Dizziness     (Consider location/radiation/quality/duration/timing/severity/associated sxs/prior Treatment) HPI   Patient is a 49 y.o. female with past medical history of anxiety and uterine fibroids, presents to the emergency department with a sharp sudden onset headache that began at approximately 12:30 today. Her headache was located over her right temple with some radiation across the top of her head and was associated with acute sensation of weakness, dizziness and hot and cold flashes, she felt slightly off balance and clammy, and the headache lasted for approximately 1 minute, rated 10 out of 10 with some radiation to her right neck and chest. After spontaneously resolving, she had a dull aching headache felt all over her head but worse on the top midline of her scalp.  She has had approximately a month of vertigo and nausea symptoms, and has gone to her primary care physician and also ENT, with normal hearing tests and without any relief from meclizine. She complains of some transient hearing disturbances and episodes of her eyes wiggling back and forth.  She denies any visual changes, neck stiffness or rigidity, fever, vomiting, slurred speech, aphasia, weakness, facial asymmetry.  She denies hx of injected drugs, illegal drug use, CP, SOB, abdominal pain.   Past Medical History  Diagnosis Date  . Anxiety    Past Surgical History  Procedure Laterality Date  . Tubal ligation     Family History  Problem Relation Age of Onset  . Hypertension Mother    Social History  Substance Use Topics  . Smoking status: Never Smoker   . Smokeless tobacco: None  . Alcohol Use: 0.0 oz/week    0 Standard drinks or equivalent per week   OB History    No data available     Review of Systems  Constitutional: Positive  for chills, fatigue and unexpected weight change.  Eyes: Positive for photophobia. Negative for redness and visual disturbance.  Respiratory: Negative.   Cardiovascular: Negative.   Gastrointestinal: Positive for nausea. Negative for vomiting, abdominal pain, diarrhea and constipation.  Endocrine: Negative.   Genitourinary: Negative.   Musculoskeletal: Positive for neck pain. Negative for gait problem and neck stiffness.  Skin: Negative.   Neurological: Positive for dizziness and headaches. Negative for tremors, syncope, facial asymmetry, speech difficulty, weakness, light-headedness and numbness.  Psychiatric/Behavioral: Negative.     Allergies  Review of patient's allergies indicates no known allergies.  Home Medications   Prior to Admission medications   Medication Sig Start Date End Date Taking? Authorizing Provider  butalbital-acetaminophen-caffeine (FIORICET) 50-325-40 MG per tablet Take 1 tablet by mouth every 6 (six) hours as needed for headache. 01/22/15 01/22/16  Delsa Grana, PA-C  sertraline (ZOLOFT) 50 MG tablet Take 50 mg by mouth daily.    Historical Provider, MD   BP 113/79 mmHg  Pulse 83  Temp(Src) 98.7 F (37.1 C) (Oral)  Resp 16  Ht 5\' 7"  (1.702 m)  Wt 154 lb (69.854 kg)  BMI 24.11 kg/m2  SpO2 100% Physical Exam  Constitutional: She is oriented to person, place, and time. She appears well-developed and well-nourished. No distress.  HENT:  Head: Normocephalic and atraumatic.  Nose: Nose normal.  Mouth/Throat: Oropharynx is clear and moist. No oropharyngeal exudate.  Eyes: Conjunctivae and EOM are normal. Pupils are equal, round, and reactive  to light. Right eye exhibits no discharge. Left eye exhibits no discharge. No scleral icterus.  Neck: Normal range of motion. No JVD present. No tracheal deviation present. No thyromegaly present.  Cardiovascular: Normal rate, regular rhythm, normal heart sounds and intact distal pulses.  Exam reveals no gallop and no friction  rub.   No murmur heard. Pulmonary/Chest: Effort normal and breath sounds normal. No respiratory distress. She has no wheezes. She has no rales. She exhibits no tenderness.  Abdominal: Soft. Bowel sounds are normal. She exhibits no distension and no mass. There is no tenderness. There is no rebound and no guarding.  Musculoskeletal: Normal range of motion. She exhibits no edema or tenderness.  Lymphadenopathy:    She has no cervical adenopathy.  Neurological: She is alert and oriented to person, place, and time. She has normal reflexes. No cranial nerve deficit. She exhibits normal muscle tone. Coordination normal.  Speech is clear and goal oriented, follows commands Major Cranial nerves without deficit, no facial droop Normal strength in upper and lower extremities bilaterally including dorsiflexion and plantar flexion, strong and equal grip strength Sensation normal to light and sharp touch Moves extremities without ataxia, coordination intact Normal finger to nose and rapid alternating movements Neg romberg, no pronator drift Normal gait and balance   Skin: Skin is warm and dry. No rash noted. She is not diaphoretic. No erythema. No pallor.  Psychiatric: She has a normal mood and affect. Her behavior is normal. Judgment and thought content normal.  Nursing note and vitals reviewed.   ED Course  Procedures (including critical care time) Labs Review Labs Reviewed  CBC WITH DIFFERENTIAL/PLATELET - Abnormal; Notable for the following:    WBC 3.9 (*)    All other components within normal limits  BASIC METABOLIC PANEL - Abnormal; Notable for the following:    Glucose, Bld 102 (*)    All other components within normal limits    Imaging Review Ct Head Wo Contrast  01/22/2015   CLINICAL DATA:  Dizziness for 1 month. Blurred vision and headache for 1 day  EXAM: CT HEAD WITHOUT CONTRAST  TECHNIQUE: Contiguous axial images were obtained from the base of the skull through the vertex without  intravenous contrast.  COMPARISON:  None.  FINDINGS: The ventricles are normal in size and configuration. There is no intracranial mass, hemorrhage, extra-axial fluid collection, or midline shift. Gray-white compartments are normal. No acute infarct evident. Bony calvarium appears intact. The mastoid air cells are clear.  IMPRESSION: Study within normal limits.   Electronically Signed   By: Lowella Grip III M.D.   On: 01/22/2015 16:09   I have personally reviewed and evaluated these images and lab results as part of my medical decision-making.   EKG Interpretation None      MDM   Final diagnoses:  Acute nonintractable headache, unspecified headache type    Pt with headache following a month history of vertigo with nausea  Pt has normal neuro exam, CT head was negative for acute pathology, no bleed, masses, or midline shift Without any neurological deficits, and normal labs, pt is stable for outpt follow up with neurology. Results have been reviewed with the patient and with her family at bedside. They have asked several questions answered. She is denying any current pain after treatment with a headache cocktail.  She has not had any vomiting while in the ED, she shows no signs of meningismus, she has normal vitals which have been stable throughout her visit, she is afebrile,  without tachycardia.  Physical exam with EOMs and Dix-Hallpike revealed no nystagmus, I have no physical findings to correlate with her reported symptoms.  Her reported vertigo does not appear to be peripheral, with negative head CT she can safely discharge home with outpt follow up with Neurology.  She has already seen ENT and PCP for this problem which has been going on for more than a month.  The family is requesting an MRI, which I have explained may be safely done as outpt. Medications  oxyCODONE-acetaminophen (PERCOCET/ROXICET) 5-325 MG per tablet 1 tablet (1 tablet Oral Given 01/22/15 1343)  sodium chloride 0.9 %  bolus 1,000 mL (0 mLs Intravenous Stopped 01/22/15 1752)  metoCLOPramide (REGLAN) injection 10 mg (10 mg Intravenous Given 01/22/15 1540)  diphenhydrAMINE (BENADRYL) injection 25 mg (25 mg Intravenous Given 01/22/15 1540)     Filed Vitals:   01/22/15 1700 01/22/15 1715 01/22/15 1730 01/22/15 1731  BP: 121/93 123/89 111/74   Pulse: 76 78 80   Temp:    98.4 F (36.9 C)  TempSrc:    Oral  Resp:      Height:      Weight:      SpO2: 100% 100% 100%         Delsa Grana, PA-C 01/25/15 1346  Pattricia Boss, MD 01/31/15 [id number]

## 2015-01-22 NOTE — Discharge Instructions (Signed)
General Headache Without Cause  A general headache is pain or discomfort felt around the head or neck area. The cause may not be found.   HOME CARE   · Keep all doctor visits.  · Only take medicines as told by your doctor.  · Lie down in a dark, quiet room when you have a headache.  · Keep a journal to find out if certain things bring on headaches. For example, write down:  ¨ What you eat and drink.  ¨ How much sleep you get.  ¨ Any change to your diet or medicines.  · Relax by getting a massage or doing other relaxing activities.  · Put ice or heat packs on the head and neck area as told by your doctor.  · Lessen stress.  · Sit up straight. Do not tighten (tense) your muscles.  · Quit smoking if you smoke.  · Lessen how much alcohol you drink.  · Lessen how much caffeine you drink, or stop drinking caffeine.  · Eat and sleep on a regular schedule.  · Get 7 to 9 hours of sleep, or as told by your doctor.  · Keep lights dim if bright lights bother you or make your headaches worse.  GET HELP RIGHT AWAY IF:   · Your headache becomes really bad.  · You have a fever.  · You have a stiff neck.  · You have trouble seeing.  · Your muscles are weak, or you lose muscle control.  · You lose your balance or have trouble walking.  · You feel like you will pass out (faint), or you pass out.  · You have really bad symptoms that are different than your first symptoms.  · You have problems with the medicines given to you by your doctor.  · Your medicines do not work.  · Your headache feels different than the other headaches.  · You feel sick to your stomach (nauseous) or throw up (vomit).  MAKE SURE YOU:   · Understand these instructions.  · Will watch your condition.  · Will get help right away if you are not doing well or get worse.  Document Released: 02/15/2008 Document Revised: 07/31/2011 Document Reviewed: 04/28/2011  Exit[employer]® Patient Information ©2015 [employer]. This information is not intended to replace advice given to  you by your health care provider. Make sure you discuss any questions you have with your health care provider.

## 2015-01-22 NOTE — ED Notes (Signed)
Pt reports dizziness for extended amount of time, has been told she has vertigo. But now also having fatigue, weakness, sharp pains in her head and neck and nausea. Denies fever.

## 2017-10-29 ENCOUNTER — Other Ambulatory Visit: Payer: Self-pay | Admitting: Family Medicine

## 2017-10-29 ENCOUNTER — Ambulatory Visit
Admission: RE | Admit: 2017-10-29 | Discharge: 2017-10-29 | Disposition: A | Discharge status: Home / Self Care | Payer: PRIVATE HEALTH INSURANCE | Source: Ambulatory Visit | Attending: Family Medicine | Admitting: Family Medicine

## 2017-10-29 DIAGNOSIS — Z1231 Encounter for screening mammogram for malignant neoplasm of breast: Secondary | ICD-10-CM

## 2017-11-26 ENCOUNTER — Other Ambulatory Visit: Payer: Self-pay | Admitting: Family Medicine

## 2017-11-26 DIAGNOSIS — J40 Bronchitis, not specified as acute or chronic: Principal | ICD-10-CM

## 2017-11-26 DIAGNOSIS — J329 Chronic sinusitis, unspecified: Secondary | ICD-10-CM

## 2017-12-06 ENCOUNTER — Ambulatory Visit
Admission: RE | Admit: 2017-12-06 | Discharge: 2017-12-06 | Disposition: A | Discharge status: Home / Self Care | Payer: PRIVATE HEALTH INSURANCE | Source: Ambulatory Visit | Attending: Family Medicine | Admitting: Family Medicine

## 2017-12-06 ENCOUNTER — Other Ambulatory Visit: Payer: Self-pay | Admitting: Family Medicine

## 2017-12-06 DIAGNOSIS — R059 Cough, unspecified: Secondary | ICD-10-CM

## 2017-12-06 DIAGNOSIS — J329 Chronic sinusitis, unspecified: Secondary | ICD-10-CM

## 2017-12-06 DIAGNOSIS — R05 Cough: Secondary | ICD-10-CM

## 2017-12-06 DIAGNOSIS — J40 Bronchitis, not specified as acute or chronic: Principal | ICD-10-CM

## 2018-03-22 ENCOUNTER — Other Ambulatory Visit: Payer: Self-pay | Admitting: Infectious Diseases

## 2018-03-22 DIAGNOSIS — J309 Allergic rhinitis, unspecified: Secondary | ICD-10-CM

## 2018-03-22 DIAGNOSIS — R51 Headache: Principal | ICD-10-CM

## 2018-03-22 DIAGNOSIS — D72810 Lymphocytopenia: Secondary | ICD-10-CM

## 2018-03-22 DIAGNOSIS — R519 Headache, unspecified: Secondary | ICD-10-CM

## 2018-03-25 ENCOUNTER — Other Ambulatory Visit: Payer: Self-pay | Admitting: Infectious Diseases

## 2018-03-25 DIAGNOSIS — M542 Cervicalgia: Secondary | ICD-10-CM

## 2018-04-03 ENCOUNTER — Other Ambulatory Visit: Payer: PRIVATE HEALTH INSURANCE

## 2018-04-15 ENCOUNTER — Ambulatory Visit
Admission: RE | Admit: 2018-04-15 | Discharge: 2018-04-15 | Disposition: A | Discharge status: Home / Self Care | Payer: PRIVATE HEALTH INSURANCE | Source: Ambulatory Visit | Attending: Infectious Diseases | Admitting: Infectious Diseases

## 2018-04-15 DIAGNOSIS — R51 Headache: Principal | ICD-10-CM

## 2018-04-15 DIAGNOSIS — J309 Allergic rhinitis, unspecified: Secondary | ICD-10-CM

## 2018-04-15 DIAGNOSIS — R519 Headache, unspecified: Secondary | ICD-10-CM

## 2018-04-15 DIAGNOSIS — M542 Cervicalgia: Secondary | ICD-10-CM

## 2018-04-15 DIAGNOSIS — D72810 Lymphocytopenia: Secondary | ICD-10-CM

## 2018-04-15 MED ORDER — GADOBENATE DIMEGLUMINE 529 MG/ML IV SOLN
14.0000 mL | Freq: Once | INTRAVENOUS | Status: AC | PRN
  Administered 2018-04-15: 14 mL via INTRAVENOUS

## 2019-05-02 ENCOUNTER — Emergency Department (HOSPITAL_BASED_OUTPATIENT_CLINIC_OR_DEPARTMENT_OTHER): Payer: PRIVATE HEALTH INSURANCE

## 2019-05-02 ENCOUNTER — Other Ambulatory Visit: Payer: Self-pay

## 2019-05-02 ENCOUNTER — Emergency Department (HOSPITAL_BASED_OUTPATIENT_CLINIC_OR_DEPARTMENT_OTHER)
Admission: EM | Admit: 2019-05-02 | Discharge: 2019-05-02 | Disposition: A | Discharge status: Home / Self Care | Payer: PRIVATE HEALTH INSURANCE | Attending: Emergency Medicine | Admitting: Emergency Medicine

## 2019-05-02 ENCOUNTER — Telehealth: Payer: Self-pay | Admitting: Radiology

## 2019-05-02 ENCOUNTER — Other Ambulatory Visit: Payer: Self-pay | Admitting: Physician Assistant

## 2019-05-02 ENCOUNTER — Encounter (HOSPITAL_BASED_OUTPATIENT_CLINIC_OR_DEPARTMENT_OTHER): Payer: Self-pay

## 2019-05-02 DIAGNOSIS — R002 Palpitations: Secondary | ICD-10-CM

## 2019-05-02 DIAGNOSIS — F17200 Nicotine dependence, unspecified, uncomplicated: Secondary | ICD-10-CM | POA: Insufficient documentation

## 2019-05-02 HISTORY — DX: Pure hypercholesterolemia, unspecified: E78.00

## 2019-05-02 LAB — CBC WITH DIFFERENTIAL/PLATELET
Abs Immature Granulocytes: 0 10*3/uL (ref 0.00–0.07)
Basophils Absolute: 0 10*3/uL (ref 0.0–0.1)
Basophils Relative: 1 %
Eosinophils Absolute: 0.1 10*3/uL (ref 0.0–0.5)
Eosinophils Relative: 2 %
HCT: 37.7 % (ref 36.0–46.0)
Hemoglobin: 11.9 g/dL — ABNORMAL LOW (ref 12.0–15.0)
Immature Granulocytes: 0 %
Lymphocytes Relative: 55 %
Lymphs Abs: 2.1 10*3/uL (ref 0.7–4.0)
MCH: 26.7 pg (ref 26.0–34.0)
MCHC: 31.6 g/dL (ref 30.0–36.0)
MCV: 84.5 fL (ref 80.0–100.0)
Monocytes Absolute: 0.3 10*3/uL (ref 0.1–1.0)
Monocytes Relative: 9 %
Neutro Abs: 1.2 10*3/uL — ABNORMAL LOW (ref 1.7–7.7)
Neutrophils Relative %: 33 %
Platelets: 296 10*3/uL (ref 150–400)
RBC: 4.46 MIL/uL (ref 3.87–5.11)
RDW: 13.8 % (ref 11.5–15.5)
WBC: 3.7 10*3/uL — ABNORMAL LOW (ref 4.0–10.5)
nRBC: 0 % (ref 0.0–0.2)

## 2019-05-02 LAB — COMPREHENSIVE METABOLIC PANEL
ALT: 14 U/L (ref 0–44)
AST: 14 U/L — ABNORMAL LOW (ref 15–41)
Albumin: 3.7 g/dL (ref 3.5–5.0)
Alkaline Phosphatase: 69 U/L (ref 38–126)
Anion gap: 6 (ref 5–15)
BUN: 18 mg/dL (ref 6–20)
CO2: 28 mmol/L (ref 22–32)
Calcium: 8.9 mg/dL (ref 8.9–10.3)
Chloride: 106 mmol/L (ref 98–111)
Creatinine, Ser: 0.95 mg/dL (ref 0.44–1.00)
GFR calc Af Amer: >60 mL/min (ref >60)
GFR calc non Af Amer: >60 mL/min (ref >60)
Glucose, Bld: 108 mg/dL — ABNORMAL HIGH (ref 70–99)
Potassium: 4 mmol/L (ref 3.5–5.1)
Sodium: 140 mmol/L (ref 135–145)
Total Bilirubin: 0.3 mg/dL (ref 0.3–1.2)
Total Protein: 7.2 g/dL (ref 6.5–8.1)

## 2019-05-02 LAB — URINALYSIS, ROUTINE W REFLEX MICROSCOPIC
Bilirubin Urine: NEGATIVE
Glucose, UA: NEGATIVE mg/dL
Hgb urine dipstick: NEGATIVE
Ketones, ur: NEGATIVE mg/dL
Leukocytes,Ua: NEGATIVE
Nitrite: NEGATIVE
Protein, ur: NEGATIVE mg/dL
Specific Gravity, Urine: 1.02 (ref 1.005–1.030)
pH: 7.5 (ref 5.0–8.0)

## 2019-05-02 LAB — TSH: TSH: 0.897 u[IU]/mL (ref 0.350–4.500)

## 2019-05-02 LAB — TROPONIN I (HIGH SENSITIVITY): Troponin I (High Sensitivity): <2 ng/L (ref <18)

## 2019-05-02 NOTE — Telephone Encounter (Signed)
Enrolled patient for a 30 Day [employer] Event monitor to be mailed to patients home. Brief instructions were gone over with the patient and she knows to expect the monitor in 5-7 days.

## 2019-05-02 NOTE — ED Triage Notes (Signed)
Pt states upon waking, felt like "heart jumping around in my chest".  Denies dizziness, feeling intermittent, but has not gone away completely, keeps returning.

## 2019-05-02 NOTE — ED Notes (Signed)
Pt to bathroom to provide specimen

## 2019-05-02 NOTE — ED Provider Notes (Signed)
[facility] EMERGENCY DEPARTMENT Provider Note   CSN: [id number] Arrival date & time: 05/02/19  0940     History Chief Complaint  Patient presents with  . Palpitations    Sherry Kline is a 53 y.o. female.  Pt presents to the ED today with palpitations.  She said she feels like her heart is skipping beats.  She felt a little nauseous as well.  No cp.  No increased caffeine intake or decongestant med intake.  Pt denies any increased stress.  This does not feel like a panic attack.  No known covid exposures.  No f/c.         Past Medical History:  Diagnosis Date  . Anxiety   . High cholesterol     There are no problems to display for this patient.   Past Surgical History:  Procedure Laterality Date  . TUBAL LIGATION       OB History   No obstetric history on file.     Family History  Problem Relation Age of Onset  . Hypertension Mother     Social History   Tobacco Use  . Smoking status: Never Smoker  . Smokeless tobacco: Current User  Substance Use Topics  . Alcohol use: Yes    Alcohol/week: 0.0 standard drinks  . Drug use: No    Home Medications Prior to Admission medications   Medication Sig Start Date End Date Taking? Authorizing Provider  ALPRAZolam (XANAX) 0.25 MG tablet Take 0.25 mg by mouth daily as needed. 01/15/19   [provider]    Allergies    Atorvastatin calcium and Rosuvastatin  Review of Systems   Review of Systems  Cardiovascular: Positive for palpitations.  All other systems reviewed and are negative.   Physical Exam Updated Vital Signs BP (!) 125/91   Pulse 71   Temp 98.4 F (36.9 C) (Oral)   Resp (!) 21   Ht 5\' 7"  (1.702 m)   Wt 77.1 kg   SpO2 100%   BMI 26.63 kg/m   Physical Exam Vitals and nursing note reviewed.  Constitutional:      Appearance: Normal appearance. She is normal weight.  HENT:     Head: Normocephalic and atraumatic.     Right Ear: External ear normal.     Left Ear:  External ear normal.     Nose: Nose normal.     Mouth/Throat:     Mouth: Mucous membranes are moist.     Pharynx: Oropharynx is clear.  Eyes:     Extraocular Movements: Extraocular movements intact.     Conjunctiva/sclera: Conjunctivae normal.     Pupils: Pupils are equal, round, and reactive to light.  Cardiovascular:     Rate and Rhythm: Normal rate and regular rhythm.     Pulses: Normal pulses.     Heart sounds: Normal heart sounds.  Pulmonary:     Effort: Pulmonary effort is normal.     Breath sounds: Normal breath sounds.  Abdominal:     General: Abdomen is flat. Bowel sounds are normal.     Palpations: Abdomen is soft.  Musculoskeletal:        General: Normal range of motion.     Cervical back: Normal range of motion and neck supple.  Skin:    General: Skin is warm.     Capillary Refill: Capillary refill takes less than 2 seconds.  Neurological:     General: No focal deficit present.     Mental Status: She  is alert and oriented to person, place, and time.  Psychiatric:        Mood and Affect: Mood normal.        Behavior: Behavior normal.        Thought Content: Thought content normal.        Judgment: Judgment normal.     ED Results / Procedures / Treatments   Labs (all labs ordered are listed, but only abnormal results are displayed) Labs Reviewed  COMPREHENSIVE METABOLIC PANEL - Abnormal; Notable for the following components:      Result Value   Glucose, Bld 108 (*)    AST 14 (*)    All other components within normal limits  CBC WITH DIFFERENTIAL/PLATELET - Abnormal; Notable for the following components:   WBC 3.7 (*)    Hemoglobin 11.9 (*)    Neutro Abs 1.2 (*)    All other components within normal limits  URINALYSIS, ROUTINE W REFLEX MICROSCOPIC  TSH  TROPONIN I (HIGH SENSITIVITY)  TROPONIN I (HIGH SENSITIVITY)    EKG EKG Interpretation  Date/Time:  Friday May 02 2019 09:54:33 EST Ventricular Rate:  77 PR Interval:    QRS Duration: 87 QT  Interval:  409 QTC Calculation: 463 R Axis:   24 Text Interpretation: Sinus rhythm Baseline wander in lead(s) V1 Confirmed by Isla Pence [phone]) on 05/02/2019 11:16:55 AM   Radiology DG Chest 2 View  Result Date: 05/02/2019 CLINICAL DATA:  Palpitations EXAM: CHEST - 2 VIEW COMPARISON:  12/06/2017 FINDINGS: Heart size is normal and unchanged. Lungs are clear. No signs of pleural effusion. No acute bone finding. IMPRESSION: Stable exam. No active cardiopulmonary disease. Electronically Signed   By: Zetta Bills M.D.   On: 05/02/2019 10:21    Procedures Procedures (including critical care time)  Medications Ordered in ED Medications - No data to display  ED Course  I have reviewed the triage vital signs and the nursing notes.  Pertinent labs & imaging results that were available during my care of the patient were reviewed by me and considered in my medical decision making (see chart for details).    MDM Rules/Calculators/A&P     CHA2DS2/VAS Stroke Risk Points      N/A >= 2 Points: High Risk  1 - 1.99 Points: Medium Risk  0 Points: Low Risk    A final score could not be computed because of missing components.: Last  Change: N/A     This score determines the patient's risk of having a stroke if the  patient has atrial fibrillation.      This score is not applicable to this patient. Components are not  calculated.                   Pt's monitor has shown no arrhythmia.  Labs are unremarkable.  Pt d/w cardiology who will set pt up with an event recorder and a follow up appointment with cards.  Pt is good with this plan.  She is stable for d/c.  Return if worse.   Final Clinical Impression(s) / ED Diagnoses Final diagnoses:  Palpitations    Rx / DC Orders ED Discharge Orders    None       Isla Pence, MD 05/02/19 1143

## 2019-05-02 NOTE — Progress Notes (Signed)
    Patient went to [facility] 12/11 with palpitations. ER MD requested cardiology evaluation. 30-day event monitor ordered, follow-up with Dr. Stanford Breed in the [facility] office in 6 weeks.  Appointment made.  Rosaria Ferries, PA-C 05/02/2019 3:56 PM Beeper [phone]

## 2019-05-24 ENCOUNTER — Encounter (INDEPENDENT_AMBULATORY_CARE_PROVIDER_SITE_OTHER): Payer: Managed Care, Other (non HMO)

## 2019-05-24 DIAGNOSIS — R002 Palpitations: Secondary | ICD-10-CM | POA: Diagnosis not present

## 2019-06-04 ENCOUNTER — Telehealth: Payer: Self-pay | Admitting: Medical

## 2019-06-04 NOTE — Telephone Encounter (Signed)
   Notified by [employer] heart monitor that the patient went into atrial flutter this afternoon with variable AV block with rates in the 70s-80s. This was an auto detection. Rhythm strips were reviewed confirming Atrial flutter with variable AV block - predominantely 3:1.   This patients CHA2DS2-VASc Score and unadjusted Ischemic Stroke Rate (% per year) is equal to 0.6 % stroke rate/year from a score of 1 Above score calculated as 1 point each if present [CHF, HTN, DM, Vascular=MI/PAD/Aortic Plaque, Age if 65-74, or Female] Above score calculated as 2 points each if present [Age > 75, or Stroke/TIA/TE]  Given low CHA2DS2-VASc Score, will not start anticoagulation at this time. Recommend starting aspirin 81mg  daily.   Rates appear to be fairly well controlled. Recommend starting low dose metoprolol 12.5mg  BID for AV nodal blockade.   Attempted to contact the patient several times this evening and left two voicemails to call back to discuss findings further. Will route to NL triage to follow-up with the patient tomorrow.   Will route this to Dr. Stanford Breed as an [place] given upcoming appointment 06/18/19.  Abigail Butts, PA-C 06/04/19; 7:32 PM

## 2019-06-05 NOTE — Telephone Encounter (Signed)
Left message for pt to call.

## 2019-06-16 NOTE — Progress Notes (Deleted)
Sherry Bernhardt MD Reason for referral-palpitations  HPI: 54 year old female for evaluation of palpitations at request of Bernerd Limbo, MD. patient seen in the emergency room on December 11 with complaints of palpitations.  Chest x-ray negative.  Troponin normal.  Potassium 4.0.  Hemoglobin 11.9.  TSH 0.897.  Event monitor requested as well as cardiology evaluation.  Current Outpatient Medications  Medication Sig Dispense Refill  . ALPRAZolam (XANAX) 0.25 MG tablet Take 0.25 mg by mouth daily as needed.     No current facility-administered medications for this visit.    Allergies  Allergen Reactions  . Atorvastatin Calcium   . Rosuvastatin     Joint pain    Past Medical History:  Diagnosis Date  . Anxiety   . High cholesterol     Past Surgical History:  Procedure Laterality Date  . TUBAL LIGATION      Social History   Socioeconomic History  . Marital status: Single    Spouse name: Not on file  . Number of children: Not on file  . Years of education: Not on file  . Highest education level: Not on file  Occupational History  . Not on file  Tobacco Use  . Smoking status: Never Smoker  . Smokeless tobacco: Current User  Substance and Sexual Activity  . Alcohol use: Yes    Alcohol/week: 0.0 standard drinks  . Drug use: No  . Sexual activity: Not on file  Other Topics Concern  . Not on file  Social History Narrative  . Not on file   Social Determinants of Health   Financial Resource Strain:   . Difficulty of Paying Living Expenses: Not on file  Food Insecurity:   . Worried About Charity fundraiser in the Last Year: Not on file  . Ran Out of Food in the Last Year: Not on file  Transportation Needs:   . Lack of Transportation (Medical): Not on file  . Lack of Transportation (Non-Medical): Not on file  Physical Activity:   . Days of Exercise per Week: Not on file  . Minutes of Exercise per Session: Not on file  Stress:   . Feeling of Stress :  Not on file  Social Connections:   . Frequency of Communication with Friends and Family: Not on file  . Frequency of Social Gatherings with Friends and Family: Not on file  . Attends Religious Services: Not on file  . Active Member of Clubs or Organizations: Not on file  . Attends Archivist Meetings: Not on file  . Marital Status: Not on file  Intimate Partner Violence:   . Fear of Current or Ex-Partner: Not on file  . Emotionally Abused: Not on file  . Physically Abused: Not on file  . Sexually Abused: Not on file    Family History  Problem Relation Age of Onset  . Hypertension Mother     ROS: no fevers or chills, productive cough, hemoptysis, dysphasia, odynophagia, melena, hematochezia, dysuria, hematuria, rash, seizure activity, orthopnea, PND, pedal edema, claudication. Remaining systems are negative.  Physical Exam:   There were no vitals taken for this visit.  General:  Well developed/well nourished in NAD Skin warm/dry Patient not depressed No peripheral clubbing Back-normal HEENT-normal/normal eyelids Neck supple/normal carotid upstroke bilaterally; no bruits; no JVD; no thyromegaly chest - CTA/ normal expansion CV - RRR/normal S1 and S2; no murmurs, rubs or gallops;  PMI nondisplaced Abdomen -NT/ND, no HSM, no mass, + bowel sounds, no bruit 2+ femoral  pulses, no bruits Ext-no edema, chords, 2+ DP Neuro-grossly nonfocal  ECG -May 02, 2019-sinus rhythm with no ST changes.  Personally reviewed  A/P  1 palpitations-  2 hyperlipidemia-follow-up primary care.  Kirk Ruths, MD

## 2019-06-17 ENCOUNTER — Telehealth: Payer: Self-pay | Admitting: Cardiology

## 2019-06-17 NOTE — Telephone Encounter (Signed)
Patient is calling to inquire whether she should send heart monitor back to office prior or bring heart monitor along during appointment scheduled for 07/02/19. Please advise.

## 2019-06-17 NOTE — Telephone Encounter (Signed)
Put all monitor equipment back into blue box it come in.  Peel of strip to expose adhesive and seal the box.  There is a prepaid [employer] shipping label on the box.  Drop off at any [employer] drop box , store, or staple, or call [employer] at [phone] to schedule [employer] package pick up at your home.

## 2019-06-17 NOTE — Telephone Encounter (Signed)
LVM for pt to call back.

## 2019-06-17 NOTE — Telephone Encounter (Signed)
Called patient, gave instructions on sending the box in.  Gave call back number to office if questions.

## 2019-06-18 ENCOUNTER — Ambulatory Visit: Payer: PRIVATE HEALTH INSURANCE | Admitting: Cardiology

## 2019-06-23 ENCOUNTER — Other Ambulatory Visit: Payer: Self-pay | Admitting: Family Medicine

## 2019-06-23 ENCOUNTER — Telehealth: Payer: Self-pay | Admitting: Cardiology

## 2019-06-23 DIAGNOSIS — R1032 Left lower quadrant pain: Secondary | ICD-10-CM

## 2019-06-23 NOTE — Telephone Encounter (Signed)
Spoke with pt, she reports putting the box in the mail today. She reports having episode over the weekend after taking off the monitor but reports she had the episodes while wearing it as well. She is asking what to do if she has another episode, explained without knowing what the rhythm is it is hard to tell her what to do but if she were to feel bad she would need to go sonewhere to be seen and have an ecg. Patient voiced understanding

## 2019-06-23 NOTE — Telephone Encounter (Signed)
Follow Up:     Pt is returning your call from 06-05-19.

## 2019-06-24 ENCOUNTER — Other Ambulatory Visit: Payer: Self-pay

## 2019-06-24 ENCOUNTER — Ambulatory Visit
Admission: RE | Admit: 2019-06-24 | Discharge: 2019-06-24 | Disposition: A | Discharge status: Home / Self Care | Payer: Managed Care, Other (non HMO) | Source: Ambulatory Visit | Attending: Family Medicine | Admitting: Family Medicine

## 2019-06-24 DIAGNOSIS — R1032 Left lower quadrant pain: Secondary | ICD-10-CM

## 2019-06-24 MED ORDER — IOPAMIDOL (ISOVUE-300) INJECTION 61%
100.0000 mL | Freq: Once | INTRAVENOUS | Status: AC | PRN
  Administered 2019-06-24: 100 mL via INTRAVENOUS

## 2019-06-24 NOTE — Progress Notes (Signed)
Sherry Bernhardt MD Reason for referral-palpitations  HPI: 54 year old female for evaluation of palpitations at request of Bernerd Limbo, MD.  Patient seen in the emergency room December 11 with complaints of palpitations.  Chest x-ray negative.  TSH normal.  Potassium 4.0 and hemoglobin 11.9.  Over the past 2 months patient describes intermittent palpitations.  They are described as a skip and pause.  They are not sustained.  If they are frequent she has some fatigue and feelings of dyspnea.  She otherwise denies dyspnea on exertion, orthopnea, PND, pedal edema, exertional chest pain or syncope.  Cardiology now asked to evaluate.  Current Outpatient Medications  Medication Sig Dispense Refill  . ALPRAZolam (XANAX) 0.25 MG tablet Take 0.25 mg by mouth daily as needed.    Marland Kitchen HYDROcodone-acetaminophen (NORCO/VICODIN) 5-325 MG tablet Take 1 tablet by mouth every 6 (six) hours as needed.     No current facility-administered medications for this visit.    Allergies  Allergen Reactions  . Atorvastatin Calcium   . Rosuvastatin     Joint pain     Past Medical History:  Diagnosis Date  . Anxiety   . High cholesterol     Past Surgical History:  Procedure Laterality Date  . TUBAL LIGATION      Social History   Socioeconomic History  . Marital status: Married    Spouse name: Not on file  . Number of children: 3  . Years of education: Not on file  . Highest education level: Not on file  Occupational History  . Not on file  Tobacco Use  . Smoking status: Never Smoker  . Smokeless tobacco: Current User  Substance and Sexual Activity  . Alcohol use: Yes    Alcohol/week: 0.0 standard drinks    Comment: Occasional  . Drug use: No  . Sexual activity: Not on file  Other Topics Concern  . Not on file  Social History Narrative  . Not on file   Social Determinants of Health   Financial Resource Strain:   . Difficulty of Paying Living Expenses: Not on file  Food  Insecurity:   . Worried About Charity fundraiser in the Last Year: Not on file  . Ran Out of Food in the Last Year: Not on file  Transportation Needs:   . Lack of Transportation (Medical): Not on file  . Lack of Transportation (Non-Medical): Not on file  Physical Activity:   . Days of Exercise per Week: Not on file  . Minutes of Exercise per Session: Not on file  Stress:   . Feeling of Stress : Not on file  Social Connections:   . Frequency of Communication with Friends and Family: Not on file  . Frequency of Social Gatherings with Friends and Family: Not on file  . Attends Religious Services: Not on file  . Active Member of Clubs or Organizations: Not on file  . Attends Archivist Meetings: Not on file  . Marital Status: Not on file  Intimate Partner Violence:   . Fear of Current or Ex-Partner: Not on file  . Emotionally Abused: Not on file  . Physically Abused: Not on file  . Sexually Abused: Not on file    Family History  Problem Relation Age of Onset  . Hypertension Mother     ROS: Ongoing evaluation for abdominal pain but no fevers or chills, productive cough, hemoptysis, dysphasia, odynophagia, melena, hematochezia, dysuria, hematuria, rash, seizure activity, orthopnea, PND, pedal edema, claudication. Remaining systems  are negative.  Physical Exam:   Blood pressure 126/76, pulse 86, height 5\' 7"  (1.702 m), weight 165 lb (74.8 kg), SpO2 97 %.  General:  Well developed/well nourished in NAD Skin warm/dry Patient not depressed No peripheral clubbing Back-normal HEENT-normal/normal eyelids Neck supple/normal carotid upstroke bilaterally; no bruits; no JVD; no thyromegaly chest - CTA/ normal expansion CV - RRR/normal S1 and S2; no murmurs, rubs or gallops;  PMI nondisplaced Abdomen -NT/ND, no HSM, no mass, + bowel sounds, no bruit 2+ femoral pulses, no bruits Ext-no edema, chords, 2+ DP Neuro-grossly nonfocal  ECG -May 02, 2019-sinus rhythm with no  ST changes.  Personally reviewed  A/P  1 palpitations-description sounds potentially to be PVCs or PACs.  She did wear a monitor recently and we will review the results when available.  Schedule echocardiogram to assess LV function.  Recent TSH and potassium normal.  She has times when she is significantly symptomatic.  I have given her a prescription for Toprol 25 mg daily as needed.  Further recommendations once results of monitor available.  2 hyperlipidemia-Per primary care.  Kirk Ruths, MD

## 2019-07-02 ENCOUNTER — Encounter: Payer: Self-pay | Admitting: Cardiology

## 2019-07-02 ENCOUNTER — Other Ambulatory Visit: Payer: Self-pay

## 2019-07-02 ENCOUNTER — Ambulatory Visit (INDEPENDENT_AMBULATORY_CARE_PROVIDER_SITE_OTHER): Payer: Managed Care, Other (non HMO) | Admitting: Cardiology

## 2019-07-02 VITALS — BP 126/76 | HR 86 | Ht 67.0 in | Wt 165.0 lb

## 2019-07-02 DIAGNOSIS — R002 Palpitations: Secondary | ICD-10-CM | POA: Diagnosis not present

## 2019-07-02 DIAGNOSIS — E7800 Pure hypercholesterolemia, unspecified: Secondary | ICD-10-CM | POA: Diagnosis not present

## 2019-07-02 MED ORDER — METOPROLOL SUCCINATE ER 25 MG PO TB24: ORAL_TABLET | ORAL | 3 refills | Status: DC

## 2019-07-02 NOTE — Patient Instructions (Signed)
Medication Instructions:  START METOPROLOL SUCC ER 25 MG ONE TABLET DAILY AS NEEDED FOR PALPITATIONS  *If you need a refill on your cardiac medications before your next appointment, please call your pharmacy*  Lab Work: If you have labs (blood work) drawn today and your tests are completely normal, you will receive your results only by: Marland Kitchen MyChart Message (if you have MyChart) OR . A paper copy in the mail If you have any lab test that is abnormal or we need to change your treatment, we will call you to review the results.  Testing/Procedures: Your physician has requested that you have an echocardiogram. Echocardiography is a painless test that uses sound waves to create images of your heart. It provides your doctor with information about the size and shape of your heart and how well your heart's chambers and valves are working. This procedure takes approximately one hour. There are no restrictions for this procedure.[place]    Follow-Up: At [facility], you and your health needs are our priority.  As part of our continuing mission to provide you with exceptional heart care, we have created designated Provider Care Teams.  These Care Teams include your primary Cardiologist (physician) and Advanced Practice Providers (APPs -  Physician Assistants and Nurse Practitioners) who all work together to provide you with the care you need, when you need it.  Your next appointment:   12 week(s)  The format for your next appointment:   In Person  Provider:   Kirk Ruths, MD

## 2019-07-15 ENCOUNTER — Ambulatory Visit (HOSPITAL_COMMUNITY): Payer: Managed Care, Other (non HMO) | Attending: Cardiology

## 2019-07-15 ENCOUNTER — Other Ambulatory Visit: Payer: Self-pay

## 2019-07-15 DIAGNOSIS — R002 Palpitations: Secondary | ICD-10-CM | POA: Diagnosis present

## 2019-07-22 NOTE — Progress Notes (Signed)
HPI: Follow-up palpitations. Patient seen in the emergency room December 11 with complaints of palpitations.  Chest x-ray negative.  TSH normal.  Potassium 4.0 and hemoglobin 11.9.   Echocardiogram February 2021 showed normal LV systolic function. Monitor February 2021 showed sinus rhythm with PACs, PVCs and paroxysmal atrial flutter. Since last seen  Current Outpatient Medications  Medication Sig Dispense Refill  . ALPRAZolam (XANAX) 0.25 MG tablet Take 0.25 mg by mouth daily as needed.    Marland Kitchen HYDROcodone-acetaminophen (NORCO/VICODIN) 5-325 MG tablet Take 1 tablet by mouth every 6 (six) hours as needed.    . metoprolol succinate (TOPROL XL) 25 MG 24 hr tablet One tablet daily as needed for palpitations 90 tablet 3   No current facility-administered medications for this visit.     Past Medical History:  Diagnosis Date  . Anxiety   . High cholesterol     Past Surgical History:  Procedure Laterality Date  . TUBAL LIGATION      Social History   Socioeconomic History  . Marital status: Married    Spouse name: Not on file  . Number of children: 3  . Years of education: Not on file  . Highest education level: Not on file  Occupational History  . Not on file  Tobacco Use  . Smoking status: Never Smoker  . Smokeless tobacco: Current User  Substance and Sexual Activity  . Alcohol use: Yes    Alcohol/week: 0.0 standard drinks    Comment: Occasional  . Drug use: No  . Sexual activity: Not on file  Other Topics Concern  . Not on file  Social History Narrative  . Not on file   Social Determinants of Health   Financial Resource Strain:   . Difficulty of Paying Living Expenses: Not on file  Food Insecurity:   . Worried About Charity fundraiser in the Last Year: Not on file  . Ran Out of Food in the Last Year: Not on file  Transportation Needs:   . Lack of Transportation (Medical): Not on file  . Lack of Transportation (Non-Medical): Not on file  Physical Activity:     . Days of Exercise per Week: Not on file  . Minutes of Exercise per Session: Not on file  Stress:   . Feeling of Stress : Not on file  Social Connections:   . Frequency of Communication with Friends and Family: Not on file  . Frequency of Social Gatherings with Friends and Family: Not on file  . Attends Religious Services: Not on file  . Active Member of Clubs or Organizations: Not on file  . Attends Archivist Meetings: Not on file  . Marital Status: Not on file  Intimate Partner Violence:   . Fear of Current or Ex-Partner: Not on file  . Emotionally Abused: Not on file  . Physically Abused: Not on file  . Sexually Abused: Not on file    Family History  Problem Relation Age of Onset  . Hypertension Mother     ROS: no fevers or chills, productive cough, hemoptysis, dysphasia, odynophagia, melena, hematochezia, dysuria, hematuria, rash, seizure activity, orthopnea, PND, pedal edema, claudication. Remaining systems are negative.  Physical Exam: Well-developed well-nourished in no acute distress.  Skin is warm and dry.  HEENT is normal.  Neck is supple.  Chest is clear to auscultation with normal expansion.  Cardiovascular exam is regular rate and rhythm.  Abdominal exam nontender or distended. No masses palpated. Extremities show no  edema. neuro grossly intact   A/P  1 paroxysmal atrial flutter-recent TSH and echocardiogram normal.  Monitor appears to show paroxysmal atrial flutter.  Her only embolic risk factor is female sex.  We will treat with aspirin 81 mg daily.  Add Toprol 25 mg daily.  I will ask one of our electrophysiologist to review as she may benefit from ablation.  2 hyperlipidemia-management per primary care.  Kirk Ruths, MD

## 2019-07-30 ENCOUNTER — Ambulatory Visit: Payer: Managed Care, Other (non HMO) | Admitting: Cardiology

## 2019-07-30 ENCOUNTER — Encounter: Payer: Self-pay | Admitting: Cardiology

## 2019-07-30 ENCOUNTER — Other Ambulatory Visit: Payer: Self-pay

## 2019-07-30 VITALS — BP 124/76 | HR 86 | Ht 67.5 in | Wt 164.1 lb

## 2019-07-30 DIAGNOSIS — I4892 Unspecified atrial flutter: Secondary | ICD-10-CM

## 2019-07-30 DIAGNOSIS — R002 Palpitations: Secondary | ICD-10-CM | POA: Diagnosis not present

## 2019-07-30 DIAGNOSIS — E7800 Pure hypercholesterolemia, unspecified: Secondary | ICD-10-CM | POA: Diagnosis not present

## 2019-07-30 MED ORDER — ASPIRIN EC 81 MG PO TBEC: 81.0000 mg | DELAYED_RELEASE_TABLET | Freq: Every day | ORAL | 3 refills | Status: AC

## 2019-07-30 MED ORDER — METOPROLOL SUCCINATE ER 25 MG PO TB24: 25.0000 mg | ORAL_TABLET | Freq: Every day | ORAL | 3 refills | Status: DC

## 2019-07-30 NOTE — Patient Instructions (Signed)
Medication Instructions:  START ASPIRIN 81 MG ONCE DAILY  INCREASE METOPROLOL TO 25 MG ONCE EVERYDAY  *If you need a refill on your cardiac medications before your next appointment, please call your pharmacy*   Lab Work: If you have labs (blood work) drawn today and your tests are completely normal, you will receive your results only by: Marland Kitchen MyChart Message (if you have MyChart) OR . A paper copy in the mail If you have any lab test that is abnormal or we need to change your treatment, we will call you to review the results.   Follow-Up: At [facility], you and your health needs are our priority.  As part of our continuing mission to provide you with exceptional heart care, we have created designated Provider Care Teams.  These Care Teams include your primary Cardiologist (physician) and Advanced Practice Providers (APPs -  Physician Assistants and Nurse Practitioners) who all work together to provide you with the care you need, when you need it.  We recommend signing up for the patient portal called "MyChart".  Sign up information is provided on this After Visit Summary.  MyChart is used to connect with patients for Virtual Visits (Telemedicine).  Patients are able to view lab/test results, encounter notes, upcoming appointments, etc.  Non-urgent messages can be sent to your provider as well.   To learn more about what you can do with MyChart, go to [email or web address].    Your next appointment:   6 month(s)  The format for your next appointment:   Either In Person or Virtual  Provider:   Kirk Ruths, MD

## 2019-08-18 NOTE — Progress Notes (Signed)
Schedule elective ov with Dr Rayann Heman to consider ablation Kirk Ruths

## 2019-08-19 ENCOUNTER — Telehealth: Payer: Self-pay | Admitting: *Deleted

## 2019-08-19 DIAGNOSIS — I4892 Unspecified atrial flutter: Secondary | ICD-10-CM

## 2019-08-19 NOTE — Telephone Encounter (Signed)
Order placed for referral to dr allred.

## 2019-08-19 NOTE — Telephone Encounter (Signed)
-----   Message from Lelon Perla, MD sent at 08/18/2019  7:24 AM EDT -----   ----- Message ----- From: Thompson Grayer, MD Sent: 08/17/2019   1:51 PM EDT To: Lelon Perla, MD  I think that you are correct that this is atrial flutter, though I cannot completely exclude some sort of baseline artifact.  I agree that she does not require anticoagulation.  Do you think he palpitations are due to the PACs (she has a number of them) or atrial flutter?  If you think history supports a more sustained issues (flutter) rather than brief PACs then I would be happy to discusss ablation with her. If you thnks is more likely PACs then I would probably just keep her on metoprolol.  I am happy to see either way.  ----- Message ----- From: Lelon Perla, MD Sent: 07/30/2019   8:02 AM EDT To: Thompson Grayer, MD  Can you review this monitor.  I think atrial flutter.  She has only 1 embolic risk factor (female sex).  Treating with Toprol and aspirin 81 mg daily.  Was going to send to you to consider ablation if you think appropriate.

## 2019-09-01 ENCOUNTER — Other Ambulatory Visit: Payer: Self-pay

## 2019-09-01 ENCOUNTER — Encounter: Payer: Self-pay | Admitting: Internal Medicine

## 2019-09-01 ENCOUNTER — Telehealth: Payer: Self-pay

## 2019-09-01 ENCOUNTER — Telehealth (INDEPENDENT_AMBULATORY_CARE_PROVIDER_SITE_OTHER): Payer: Managed Care, Other (non HMO) | Admitting: Internal Medicine

## 2019-09-01 VITALS — Ht 67.5 in | Wt 165.0 lb

## 2019-09-01 DIAGNOSIS — R0683 Snoring: Secondary | ICD-10-CM

## 2019-09-01 DIAGNOSIS — I4892 Unspecified atrial flutter: Secondary | ICD-10-CM | POA: Diagnosis not present

## 2019-09-01 NOTE — Telephone Encounter (Signed)
Outreach made to Pt.  Went to VM.  Left message requesting call back in reference to sleep study.

## 2019-09-01 NOTE — Progress Notes (Signed)
Electrophysiology TeleHealth Note  Due to national recommendations of social distancing due to [place] 19, an audio telehealth visit is felt to be most appropriate for this patient at this time.  Verbal consent was obtained by me for the telehealth visit today.  The patient does not have capability for a virtual visit.  A phone visit is therefore required today.   Date:  09/01/2019   ID:  Sherry Kline, Sherry Kline 1966/02/20, MRN [id number]  Location: patient's home  Provider location:  [place]  Evaluation Performed: Follow-up visit  PCP:  Bernerd Limbo, MD   Electrophysiologist:  Dr Rayann Heman  Chief Complaint:  New evaluation for atrial flutter  History of Present Illness:    Sherry Kline is a 54 y.o. female who presents today referred by Dr Stanford Breed for evaluation of atrial flutter via telehealth conferencing today. She had palpitations and wore an event monitor which demonstrated atrial flutter. She is referred today for treatment options.   She has been treated with BB with recurrent palpitations.Today, she denies symptoms of chest pain, shortness of breath,  lower extremity edema, dizziness, presyncope, or syncope.  The patient is otherwise without complaint today.  The patient denies symptoms of fevers, chills, cough, or new SOB worrisome for COVID 19.  Past Medical History:  Diagnosis Date  . Anxiety   . High cholesterol     Past Surgical History:  Procedure Laterality Date  . TUBAL LIGATION      Current Outpatient Medications  Medication Sig Dispense Refill  . ALPRAZolam (XANAX) 0.25 MG tablet Take 0.25 mg by mouth daily as needed.    Marland Kitchen aspirin EC 81 MG tablet Take 1 tablet (81 mg total) by mouth daily. 90 tablet 3  . HYDROcodone-acetaminophen (NORCO/VICODIN) 5-325 MG tablet Take 1 tablet by mouth every 6 (six) hours as needed.    . metoprolol succinate (TOPROL XL) 25 MG 24 hr tablet Take 1 tablet (25 mg total) by mouth daily. One tablet daily as needed for  palpitations 90 tablet 3   No current facility-administered medications for this visit.    Allergies:   Atorvastatin calcium and Rosuvastatin   Social History:  The patient  reports that she has never smoked. She uses smokeless tobacco. She reports current alcohol use. She reports that she does not use drugs.   Family History:  The patient's family history includes Hypertension in her mother.   ROS:  Please see the history of present illness.   All other systems are personally reviewed and negative.    Exam:    Vital Signs:  Ht 5' 7.5" (1.715 m)   Wt 165 lb (74.8 kg)   BMI 25.46 kg/m   Well sounding, alert and conversant   Labs/Other Tests and Data Reviewed:    Recent Labs: 05/02/2019: ALT 14; BUN 18; Creatinine, Ser 0.95; Hemoglobin 11.9; Platelets 296; Potassium 4.0; Sodium 140; TSH 0.897   Wt Readings from Last 3 Encounters:  09/01/19 165 lb (74.8 kg)  07/30/19 164 lb 1.9 oz (74.4 kg)  07/02/19 165 lb (74.8 kg)      ASSESSMENT & PLAN:    1.  Atrial flutter Treatment options discussed with patient and her husband today The patient has symptomatic, recurrent atrial flutter she has failed medical therapy with BB. Chads2vasc score is 1.   Therapeutic strategies for atrial flutter including medicine and ablation were discussed in detail with the patient today. Risk, benefits, and alternatives to EP study and radiofrequency ablation for atrial  flutter were also discussed in detail today. These risks include but are not limited to stroke, bleeding, vascular damage, tamponade, perforation, damage to the esophagus, lungs, and other structures, worsening renal function, and death. The patient understands these risk and will discuss with her husband and let us know if she would like to proceed. Carto, ICE, anesthesia are requested for the procedure.  Would stop ASA and start Xarelto prior to ablation   2.  Snoring Would recommend sleep study evaluation    Follow-up:  She  will call if she decides to proceed with ablation    Patient Risk:  after full review of this patients clinical status, I feel that they are at moderate risk at this time.  Today, I have spent 15 minutes with the patient with telehealth technology discussing arrhythmia management .    Army Fossa, MD  09/01/2019 4:06 PM     [address] [address] [phone] (office) [phone] (fax)

## 2019-09-01 NOTE — Telephone Encounter (Signed)
-----   Message from Thompson Grayer, MD sent at 09/01/2019  4:08 PM EDT ----- Sleep study to evaluate snoring  She will call you if she decides to proceed with ablation.  Would use carto and anesthesia.  Ok to stop ASA and start xarelto prior to ablation.  Does not need to wait 3 weeks

## 2019-09-16 ENCOUNTER — Encounter (HOSPITAL_BASED_OUTPATIENT_CLINIC_OR_DEPARTMENT_OTHER): Payer: Self-pay

## 2019-09-16 ENCOUNTER — Emergency Department (HOSPITAL_BASED_OUTPATIENT_CLINIC_OR_DEPARTMENT_OTHER): Payer: Managed Care, Other (non HMO)

## 2019-09-16 ENCOUNTER — Other Ambulatory Visit: Payer: Self-pay

## 2019-09-16 ENCOUNTER — Emergency Department (HOSPITAL_BASED_OUTPATIENT_CLINIC_OR_DEPARTMENT_OTHER)
Admission: EM | Admit: 2019-09-16 | Discharge: 2019-09-16 | Disposition: A | Discharge status: Home / Self Care | Payer: Managed Care, Other (non HMO) | Attending: Emergency Medicine | Admitting: Emergency Medicine

## 2019-09-16 DIAGNOSIS — K5792 Diverticulitis of intestine, part unspecified, without perforation or abscess without bleeding: Secondary | ICD-10-CM

## 2019-09-16 DIAGNOSIS — K5732 Diverticulitis of large intestine without perforation or abscess without bleeding: Secondary | ICD-10-CM | POA: Insufficient documentation

## 2019-09-16 DIAGNOSIS — Z79899 Other long term (current) drug therapy: Secondary | ICD-10-CM | POA: Insufficient documentation

## 2019-09-16 DIAGNOSIS — R1032 Left lower quadrant pain: Secondary | ICD-10-CM | POA: Diagnosis present

## 2019-09-16 LAB — COMPREHENSIVE METABOLIC PANEL
ALT: 11 U/L (ref 0–44)
AST: 10 U/L — ABNORMAL LOW (ref 15–41)
Albumin: 4 g/dL (ref 3.5–5.0)
Alkaline Phosphatase: 72 U/L (ref 38–126)
Anion gap: 9 (ref 5–15)
BUN: 11 mg/dL (ref 6–20)
CO2: 26 mmol/L (ref 22–32)
Calcium: 9 mg/dL (ref 8.9–10.3)
Chloride: 104 mmol/L (ref 98–111)
Creatinine, Ser: 0.83 mg/dL (ref 0.44–1.00)
GFR calc Af Amer: >60 mL/min (ref >60)
GFR calc non Af Amer: >60 mL/min (ref >60)
Glucose, Bld: 112 mg/dL — ABNORMAL HIGH (ref 70–99)
Potassium: 3.8 mmol/L (ref 3.5–5.1)
Sodium: 139 mmol/L (ref 135–145)
Total Bilirubin: 0.6 mg/dL (ref 0.3–1.2)
Total Protein: 8 g/dL (ref 6.5–8.1)

## 2019-09-16 LAB — URINALYSIS, MICROSCOPIC (REFLEX)

## 2019-09-16 LAB — LIPASE, BLOOD: Lipase: 25 U/L (ref 11–51)

## 2019-09-16 LAB — CBC
HCT: 40.4 % (ref 36.0–46.0)
Hemoglobin: 13.3 g/dL (ref 12.0–15.0)
MCH: 27.1 pg (ref 26.0–34.0)
MCHC: 32.9 g/dL (ref 30.0–36.0)
MCV: 82.3 fL (ref 80.0–100.0)
Platelets: 348 10*3/uL (ref 150–400)
RBC: 4.91 MIL/uL (ref 3.87–5.11)
RDW: 14 % (ref 11.5–15.5)
WBC: 6.7 10*3/uL (ref 4.0–10.5)
nRBC: 0 % (ref 0.0–0.2)

## 2019-09-16 LAB — URINALYSIS, ROUTINE W REFLEX MICROSCOPIC
Bilirubin Urine: NEGATIVE
Glucose, UA: NEGATIVE mg/dL
Ketones, ur: NEGATIVE mg/dL
Leukocytes,Ua: NEGATIVE
Nitrite: NEGATIVE
Protein, ur: NEGATIVE mg/dL
Specific Gravity, Urine: >1.03 — ABNORMAL HIGH (ref 1.005–1.030)
pH: 5.5 (ref 5.0–8.0)

## 2019-09-16 LAB — PREGNANCY, URINE: Preg Test, Ur: NEGATIVE

## 2019-09-16 MED ORDER — IOHEXOL 300 MG/ML  SOLN
100.0000 mL | Freq: Once | INTRAMUSCULAR | Status: AC | PRN
  Administered 2019-09-16: 100 mL via INTRAVENOUS

## 2019-09-16 MED ORDER — OXYCODONE-ACETAMINOPHEN 5-325 MG PO TABS
1.0000 | ORAL_TABLET | Freq: Once | ORAL | Status: AC
  Administered 2019-09-16: 1 via ORAL
  Filled 2019-09-16: qty 1

## 2019-09-16 MED ORDER — FENTANYL CITRATE (PF) 100 MCG/2ML IJ SOLN
50.0000 ug | Freq: Once | INTRAMUSCULAR | Status: AC
  Administered 2019-09-16: 50 ug via INTRAVENOUS
  Filled 2019-09-16: qty 2

## 2019-09-16 MED ORDER — SODIUM CHLORIDE 0.9% FLUSH
3.0000 mL | Freq: Once | INTRAVENOUS | Status: AC
  Filled 2019-09-16: qty 3

## 2019-09-16 MED ORDER — SODIUM CHLORIDE 0.9 % IV BOLUS
1000.0000 mL | Freq: Once | INTRAVENOUS | Status: AC
  Administered 2019-09-16: 1000 mL via INTRAVENOUS

## 2019-09-16 MED ORDER — AMOXICILLIN-POT CLAVULANATE 875-125 MG PO TABS
1.0000 | ORAL_TABLET | Freq: Once | ORAL | Status: AC
  Administered 2019-09-16: 1 via ORAL
  Filled 2019-09-16: qty 1

## 2019-09-16 MED ORDER — AMOXICILLIN-POT CLAVULANATE 875-125 MG PO TABS: 1.0000 | ORAL_TABLET | Freq: Two times a day (BID) | ORAL | 0 refills | Status: AC

## 2019-09-16 MED ORDER — ONDANSETRON HCL 4 MG/2ML IJ SOLN
4.0000 mg | Freq: Once | INTRAMUSCULAR | Status: AC
  Administered 2019-09-16: 4 mg via INTRAVENOUS
  Filled 2019-09-16: qty 2

## 2019-09-16 MED FILL — AMOX-CLAV 875-125 MG TABLET: 875-125 | 10 days supply | Qty: 20 | Fill #0

## 2019-09-16 NOTE — ED Provider Notes (Signed)
[facility] EMERGENCY DEPARTMENT Provider Note   CSN: [id number] Arrival date & time: 09/16/19  1045     History Chief Complaint  Patient presents with  . Abdominal Pain    Sherry Kline is a 54 y.o. female.  The history is provided by the patient.  Abdominal Pain Pain location:  LLQ Pain quality: aching   Pain radiates to:  Does not radiate Pain severity:  Mild Onset quality:  Gradual Duration:  2 days Timing:  Constant Progression:  Unchanged Chronicity:  New Context: not previous surgeries, not sick contacts and not suspicious food intake   Relieved by:  Nothing Worsened by:  Nothing Associated symptoms: no anorexia, no belching, no chest pain, no chills, no cough, no dysuria, no fatigue, no fever, no hematuria, no shortness of breath, no sore throat, no vaginal bleeding, no vaginal discharge and no vomiting   Risk factors: has not had multiple surgeries        Past Medical History:  Diagnosis Date  . Anxiety   . High cholesterol     There are no problems to display for this patient.   Past Surgical History:  Procedure Laterality Date  . TUBAL LIGATION       OB History   No obstetric history on file.     Family History  Problem Relation Age of Onset  . Hypertension Mother     Social History   Tobacco Use  . Smoking status: Never Smoker  . Smokeless tobacco: Current User  Substance Use Topics  . Alcohol use: Yes    Alcohol/week: 0.0 standard drinks    Comment: Occasional  . Drug use: No    Home Medications Prior to Admission medications   Medication Sig Start Date End Date Taking? Authorizing Provider  ALPRAZolam (XANAX) 0.25 MG tablet Take 0.25 mg by mouth daily as needed. 01/15/19   [provider]  amoxicillin-clavulanate (AUGMENTIN) 875-125 MG tablet Take 1 tablet by mouth 2 (two) times daily for 10 days. 09/16/19 09/26/19  Latressa Harries, DO  aspirin EC 81 MG tablet Take 1 tablet (81 mg total) by mouth daily.  07/30/19   Lelon Perla, MD  HYDROcodone-acetaminophen (NORCO/VICODIN) 5-325 MG tablet Take 1 tablet by mouth every 6 (six) hours as needed. 06/23/19   [provider]  metoprolol succinate (TOPROL XL) 25 MG 24 hr tablet Take 1 tablet (25 mg total) by mouth daily. One tablet daily as needed for palpitations 07/30/19   Lelon Perla, MD    Allergies    Atorvastatin calcium and Rosuvastatin  Review of Systems   Review of Systems  Constitutional: Negative for chills, fatigue and fever.  HENT: Negative for ear pain and sore throat.   Eyes: Negative for pain and visual disturbance.  Respiratory: Negative for cough and shortness of breath.   Cardiovascular: Negative for chest pain and palpitations.  Gastrointestinal: Positive for abdominal pain. Negative for anorexia and vomiting.  Genitourinary: Negative for dysuria, hematuria, vaginal bleeding and vaginal discharge.  Musculoskeletal: Negative for arthralgias and back pain.  Skin: Negative for color change and rash.  Neurological: Negative for seizures and syncope.  All other systems reviewed and are negative.   Physical Exam Updated Vital Signs  ED Triage Vitals  Enc Vitals Group     BP 09/16/19 1052 117/84     Pulse Rate 09/16/19 1052 87     Resp 09/16/19 1052 18     Temp 09/16/19 1052 98.8 F (37.1 C)  Temp Source 09/16/19 1052 Oral     SpO2 09/16/19 1052 98 %     Weight 09/16/19 1053 160 lb (72.6 kg)     Height 09/16/19 1053 5\' 7"  (1.702 m)     Head Circumference --      Peak Flow --      Pain Score 09/16/19 1059 10     Pain Loc --      Pain Edu? --      Excl. in [place]? --     Physical Exam Vitals and nursing note reviewed.  Constitutional:      General: She is not in acute distress.    Appearance: She is well-developed. She is not ill-appearing.  HENT:     Head: Normocephalic and atraumatic.  Eyes:     Extraocular Movements: Extraocular movements intact.     Conjunctiva/sclera: Conjunctivae normal.       Pupils: Pupils are equal, round, and reactive to light.  Cardiovascular:     Rate and Rhythm: Normal rate and regular rhythm.     Heart sounds: Normal heart sounds. No murmur.  Pulmonary:     Effort: Pulmonary effort is normal. No respiratory distress.     Breath sounds: Normal breath sounds.  Abdominal:     General: Abdomen is flat. There is no distension.     Palpations: Abdomen is soft.     Tenderness: There is abdominal tenderness in the left lower quadrant. There is guarding. There is no rebound.  Musculoskeletal:     Cervical back: Neck supple.  Skin:    General: Skin is warm and dry.  Neurological:     General: No focal deficit present.     Mental Status: She is alert.  Psychiatric:        Mood and Affect: Mood normal.     ED Results / Procedures / Treatments   Labs (all labs ordered are listed, but only abnormal results are displayed) Labs Reviewed  COMPREHENSIVE METABOLIC PANEL - Abnormal; Notable for the following components:      Result Value   Glucose, Bld 112 (*)    AST 10 (*)    All other components within normal limits  URINALYSIS, ROUTINE W REFLEX MICROSCOPIC - Abnormal; Notable for the following components:   Specific Gravity, Urine >1.030 (*)    Hgb urine dipstick TRACE (*)    All other components within normal limits  URINALYSIS, MICROSCOPIC (REFLEX) - Abnormal; Notable for the following components:   Bacteria, UA FEW (*)    All other components within normal limits  LIPASE, BLOOD  CBC  PREGNANCY, URINE    EKG None  Radiology CT ABDOMEN PELVIS W CONTRAST  Result Date: 09/16/2019 CLINICAL DATA:  Abdomen distension left lower quadrant pain EXAM: CT ABDOMEN AND PELVIS WITH CONTRAST TECHNIQUE: Multidetector CT imaging of the abdomen and pelvis was performed using the standard protocol following bolus administration of intravenous contrast. CONTRAST:  186mL OMNIPAQUE IOHEXOL 300 MG/ML  SOLN COMPARISON:  CT 06/24/2019 FINDINGS: Lower chest: No acute  abnormality. Hepatobiliary: No focal liver abnormality is seen. No gallstones, gallbladder wall thickening, or biliary dilatation. Pancreas: Unremarkable. No pancreatic ductal dilatation or surrounding inflammatory changes. Spleen: Normal in size without focal abnormality. Adrenals/Urinary Tract: Adrenal glands are unremarkable. Kidneys are normal, without renal calculi, focal lesion, or hydronephrosis. Bladder is unremarkable. Stomach/Bowel: Stomach within normal limits. No dilated small bowel. Focal wall thickening at the distal descending/proximal sigmoid colon with moderate inflammatory changes and diverticula present. No extraluminal gas. No abscess.  Vascular/Lymphatic: No significant vascular findings are present. No enlarged abdominal or pelvic lymph nodes. Reproductive: No adnexal mass. Lobulated uterus with multiple masses compatible with fibroids. Other: Negative for free air or free fluid. Musculoskeletal: Degenerative changes. No acute or suspicious osseous abnormality IMPRESSION: 1. Findings consistent with acute diverticulitis involving the distal descending/proximal sigmoid colon. Negative for perforation or abscess. 2. Fibroid uterus Electronically Signed   By: Donavan Foil M.D.   On: 09/16/2019 16:18    Procedures Procedures (including critical care time)  Medications Ordered in ED Medications  sodium chloride flush (NS) 0.9 % injection 3 mL (0 mLs Intravenous Return to [facility] 09/16/19 1601)  ondansetron (ZOFRAN) injection 4 mg (4 mg Intravenous Given 09/16/19 1520)  fentaNYL (SUBLIMAZE) injection 50 mcg (50 mcg Intravenous Given 09/16/19 1520)  sodium chloride 0.9 % bolus 1,000 mL (1,000 mLs Intravenous New Bag/Given 09/16/19 1609)  iohexol (OMNIPAQUE) 300 MG/ML solution 100 mL (100 mLs Intravenous Contrast Given 09/16/19 1556)  amoxicillin-clavulanate (AUGMENTIN) 875-125 MG per tablet 1 tablet (1 tablet Oral Given 09/16/19 1635)    ED Course  I have reviewed the triage vital signs and  the nursing notes.  Pertinent labs & imaging results that were available during my care of the patient were reviewed by me and considered in my medical decision making (see chart for details).    MDM Rules/Calculators/A&P                      Sherry Kline is a 54 year old female with history of high cholesterol who presents to the ED with left lower quadrant abdominal pain.  Pain since yesterday.  Normal vitals.  No fever.  Tender in the left lower quadrant.  Concern for diverticulitis.  Possible UTI.  Less likely bowel obstruction.  Will get lab work including CT scan abdomen pelvis.  Will give IV fluids, IV fentanyl, IV Zofran.  CT scan shows diverticulitis. No abscess, no bowel perforation. Otherwise lab work is unremarkable. No significant anemia, electrolyte abnormality. Will prescribe Augmentin. Understands return precautions and discharged in ED in good condition.  This chart was dictated using voice recognition software.  Despite best efforts to proofread,  errors can occur which can change the documentation meaning.    Final Clinical Impression(s) / ED Diagnoses Final diagnoses:  Acute diverticulitis    Rx / DC Orders ED Discharge Orders         Ordered    amoxicillin-clavulanate (AUGMENTIN) 875-125 MG tablet  2 times daily     09/16/19 1712           Lennice Sites, DO 09/16/19 1713

## 2019-09-16 NOTE — ED Notes (Signed)
Pt ambulated to RR with IV pole unassisted

## 2019-09-16 NOTE — ED Triage Notes (Signed)
Pt arrives with c/o LLQ pain since yesterday. Some nausea, no vomiting or diarrhea.

## 2019-09-16 NOTE — ED Notes (Signed)
Pt discharged to home. Discharge instructions have been discussed with patient and/or family members. Pt verbally acknowledges understanding d/c instructions, and endorses comprehension to checkout at registration before leaving.  °

## 2019-09-22 NOTE — Telephone Encounter (Signed)
Outreach made to Pt.  LMTCB  Outreach x 2 regarding sleep study

## 2019-09-22 NOTE — Progress Notes (Deleted)
HPI: Follow-up palpitations. Patient seen in the emergency room December 11 with complaints of palpitations. Chest x-ray negative. TSH normal. Potassium 4.0 and hemoglobin 11.9. Echocardiogram February 2021 showed normal LV systolic function. Monitor February 2021 showed sinus rhythm with PACs, PVCs and paroxysmal atrial flutter.  Patient seen by Dr. Rayann Heman atrial flutter ablation recommended.  Patient wanted to consider.  Since last seen  Current Outpatient Medications  Medication Sig Dispense Refill  . ALPRAZolam (XANAX) 0.25 MG tablet Take 0.25 mg by mouth daily as needed.    Marland Kitchen amoxicillin-clavulanate (AUGMENTIN) 875-125 MG tablet Take 1 tablet by mouth 2 (two) times daily for 10 days. 20 tablet 0  . aspirin EC 81 MG tablet Take 1 tablet (81 mg total) by mouth daily. 90 tablet 3  . HYDROcodone-acetaminophen (NORCO/VICODIN) 5-325 MG tablet Take 1 tablet by mouth every 6 (six) hours as needed.    . metoprolol succinate (TOPROL XL) 25 MG 24 hr tablet Take 1 tablet (25 mg total) by mouth daily. One tablet daily as needed for palpitations 90 tablet 3   No current facility-administered medications for this visit.     Past Medical History:  Diagnosis Date  . Anxiety   . High cholesterol     Past Surgical History:  Procedure Laterality Date  . TUBAL LIGATION      Social History   Socioeconomic History  . Marital status: Married    Spouse name: Not on file  . Number of children: 3  . Years of education: Not on file  . Highest education level: Not on file  Occupational History  . Not on file  Tobacco Use  . Smoking status: Never Smoker  . Smokeless tobacco: Current User  Substance and Sexual Activity  . Alcohol use: Yes    Alcohol/week: 0.0 standard drinks    Comment: Occasional  . Drug use: No  . Sexual activity: Not on file  Other Topics Concern  . Not on file  Social History Narrative  . Not on file   Social Determinants of Health   Financial Resource  Strain:   . Difficulty of Paying Living Expenses:   Food Insecurity:   . Worried About Charity fundraiser in the Last Year:   . Arboriculturist in the Last Year:   Transportation Needs:   . Film/video editor (Medical):   Marland Kitchen Lack of Transportation (Non-Medical):   Physical Activity:   . Days of Exercise per Week:   . Minutes of Exercise per Session:   Stress:   . Feeling of Stress :   Social Connections:   . Frequency of Communication with Friends and Family:   . Frequency of Social Gatherings with Friends and Family:   . Attends Religious Services:   . Active Member of Clubs or Organizations:   . Attends Archivist Meetings:   Marland Kitchen Marital Status:   Intimate Partner Violence:   . Fear of Current or Ex-Partner:   . Emotionally Abused:   Marland Kitchen Physically Abused:   . Sexually Abused:     Family History  Problem Relation Age of Onset  . Hypertension Mother     ROS: no fevers or chills, productive cough, hemoptysis, dysphasia, odynophagia, melena, hematochezia, dysuria, hematuria, rash, seizure activity, orthopnea, PND, pedal edema, claudication. Remaining systems are negative.  Physical Exam: Well-developed well-nourished in no acute distress.  Skin is warm and dry.  HEENT is normal.  Neck is supple.  Chest is clear to  auscultation with normal expansion.  Cardiovascular exam is regular rate and rhythm.  Abdominal exam nontender or distended. No masses palpated. Extremities show no edema. neuro grossly intact  ECG- personally reviewed  A/P  1 paroxysmal atrial flutter-CHADSvasc 1 for female sex; continue aspirin 81 mg daily.  Continue Toprol 25 mg daily.  2 hyperlipidemia-primary care.  Kirk Ruths, MD

## 2019-10-03 ENCOUNTER — Other Ambulatory Visit: Payer: Self-pay | Admitting: *Deleted

## 2019-10-03 DIAGNOSIS — Z1231 Encounter for screening mammogram for malignant neoplasm of breast: Secondary | ICD-10-CM

## 2019-10-06 ENCOUNTER — Ambulatory Visit: Payer: Managed Care, Other (non HMO) | Admitting: Cardiology

## 2019-10-07 ENCOUNTER — Other Ambulatory Visit: Payer: Self-pay

## 2019-10-07 ENCOUNTER — Ambulatory Visit
Admission: RE | Admit: 2019-10-07 | Discharge: 2019-10-07 | Disposition: A | Discharge status: Home / Self Care | Payer: Managed Care, Other (non HMO) | Source: Ambulatory Visit | Attending: *Deleted | Admitting: *Deleted

## 2019-10-07 DIAGNOSIS — Z1231 Encounter for screening mammogram for malignant neoplasm of breast: Secondary | ICD-10-CM

## 2019-10-24 ENCOUNTER — Telehealth: Payer: Self-pay

## 2019-10-24 NOTE — Telephone Encounter (Signed)
Letter mailed to pt advising to contact this nurse if she would like to be scheduled for sleep study.  Await further needs.

## 2019-10-24 NOTE — Telephone Encounter (Signed)
Letter sent to Pt advising to contact office if interested in sleep study  Await further needs

## 2019-12-20 NOTE — Progress Notes (Deleted)
HPI: Follow-up palpitations. Echocardiogram February 2021 showed normal LV systolic function. Monitor February 2021 showed sinus rhythm with PACs, PVCs and paroxysmal atrial flutter.  Patient seen in follow-up by Dr. Rayann Heman and ablation of atrial flutter recommended.  Since last seen  Current Outpatient Medications  Medication Sig Dispense Refill   ALPRAZolam (XANAX) 0.25 MG tablet Take 0.25 mg by mouth daily as needed.     aspirin EC 81 MG tablet Take 1 tablet (81 mg total) by mouth daily. 90 tablet 3   HYDROcodone-acetaminophen (NORCO/VICODIN) 5-325 MG tablet Take 1 tablet by mouth every 6 (six) hours as needed.     metoprolol succinate (TOPROL XL) 25 MG 24 hr tablet Take 1 tablet (25 mg total) by mouth daily. One tablet daily as needed for palpitations 90 tablet 3   No current facility-administered medications for this visit.     Past Medical History:  Diagnosis Date   Anxiety    High cholesterol     Past Surgical History:  Procedure Laterality Date   TUBAL LIGATION      Social History   Socioeconomic History   Marital status: Married    Spouse name: Not on file   Number of children: 3   Years of education: Not on file   Highest education level: Not on file  Occupational History   Not on file  Tobacco Use   Smoking status: Never Smoker   Smokeless tobacco: Current User  Substance and Sexual Activity   Alcohol use: Yes    Alcohol/week: 0.0 standard drinks    Comment: Occasional   Drug use: No   Sexual activity: Not on file  Other Topics Concern   Not on file  Social History Narrative   Not on file   Social Determinants of Health   Financial Resource Strain:    Difficulty of Paying Living Expenses:   Food Insecurity:    Worried About Charity fundraiser in the Last Year:    Arboriculturist in the Last Year:   Transportation Needs:    Film/video editor (Medical):    Lack of Transportation (Non-Medical):   Physical  Activity:    Days of Exercise per Week:    Minutes of Exercise per Session:   Stress:    Feeling of Stress :   Social Connections:    Frequency of Communication with Friends and Family:    Frequency of Social Gatherings with Friends and Family:    Attends Religious Services:    Active Member of Clubs or Organizations:    Attends Music therapist:    Marital Status:   Intimate Partner Violence:    Fear of Current or Ex-Partner:    Emotionally Abused:    Physically Abused:    Sexually Abused:     Family History  Problem Relation Age of Onset   Hypertension Mother     ROS: no fevers or chills, productive cough, hemoptysis, dysphasia, odynophagia, melena, hematochezia, dysuria, hematuria, rash, seizure activity, orthopnea, PND, pedal edema, claudication. Remaining systems are negative.  Physical Exam: Well-developed well-nourished in no acute distress.  Skin is warm and dry.  HEENT is normal.  Neck is supple.  Chest is clear to auscultation with normal expansion.  Cardiovascular exam is regular rate and rhythm.  Abdominal exam nontender or distended. No masses palpated. Extremities show no edema. neuro grossly intact  ECG- personally reviewed  A/P  1 atrial flutter-plan to continue Toprol at present dose.  She was seen by Dr. Rayann Heman previously and ablation recommended.  2 hyperlipidemia-follow-up primary care.  Kirk Ruths, MD

## 2019-12-26 ENCOUNTER — Ambulatory Visit: Payer: Managed Care, Other (non HMO) | Admitting: Cardiology

## 2020-03-08 NOTE — Progress Notes (Deleted)
HPI: Follow-up palpitations. Echocardiogram February 2021 showed normal LV systolic function. Monitor February 2021 showed sinus rhythm with PACs, PVCs and paroxysmal atrial flutter.  Patient was seen by Dr. Rayann Heman April 2021 and atrial flutter ablation was offered.  Since last seen  Current Outpatient Medications  Medication Sig Dispense Refill  . ALPRAZolam (XANAX) 0.25 MG tablet Take 0.25 mg by mouth daily as needed.    Marland Kitchen aspirin EC 81 MG tablet Take 1 tablet (81 mg total) by mouth daily. 90 tablet 3  . HYDROcodone-acetaminophen (NORCO/VICODIN) 5-325 MG tablet Take 1 tablet by mouth every 6 (six) hours as needed.    . metoprolol succinate (TOPROL XL) 25 MG 24 hr tablet Take 1 tablet (25 mg total) by mouth daily. One tablet daily as needed for palpitations 90 tablet 3   No current facility-administered medications for this visit.     Past Medical History:  Diagnosis Date  . Anxiety   . High cholesterol     Past Surgical History:  Procedure Laterality Date  . TUBAL LIGATION      Social History   Socioeconomic History  . Marital status: Married    Spouse name: Not on file  . Number of children: 3  . Years of education: Not on file  . Highest education level: Not on file  Occupational History  . Not on file  Tobacco Use  . Smoking status: Never Smoker  . Smokeless tobacco: Current User  Substance and Sexual Activity  . Alcohol use: Yes    Alcohol/week: 0.0 standard drinks    Comment: Occasional  . Drug use: No  . Sexual activity: Not on file  Other Topics Concern  . Not on file  Social History Narrative  . Not on file   Social Determinants of Health   Financial Resource Strain:   . Difficulty of Paying Living Expenses: Not on file  Food Insecurity:   . Worried About Charity fundraiser in the Last Year: Not on file  . Ran Out of Food in the Last Year: Not on file  Transportation Needs:   . Lack of Transportation (Medical): Not on file  . Lack of  Transportation (Non-Medical): Not on file  Physical Activity:   . Days of Exercise per Week: Not on file  . Minutes of Exercise per Session: Not on file  Stress:   . Feeling of Stress : Not on file  Social Connections:   . Frequency of Communication with Friends and Family: Not on file  . Frequency of Social Gatherings with Friends and Family: Not on file  . Attends Religious Services: Not on file  . Active Member of Clubs or Organizations: Not on file  . Attends Archivist Meetings: Not on file  . Marital Status: Not on file  Intimate Partner Violence:   . Fear of Current or Ex-Partner: Not on file  . Emotionally Abused: Not on file  . Physically Abused: Not on file  . Sexually Abused: Not on file    Family History  Problem Relation Age of Onset  . Hypertension Mother     ROS: no fevers or chills, productive cough, hemoptysis, dysphasia, odynophagia, melena, hematochezia, dysuria, hematuria, rash, seizure activity, orthopnea, PND, pedal edema, claudication. Remaining systems are negative.  Physical Exam: Well-developed well-nourished in no acute distress.  Skin is warm and dry.  HEENT is normal.  Neck is supple.  Chest is clear to auscultation with normal expansion.  Cardiovascular exam is  regular rate and rhythm.  Abdominal exam nontender or distended. No masses palpated. Extremities show no edema. neuro grossly intact  ECG- personally reviewed  A/P  1 paroxysmal atrial flutter-plan to continue Toprol and aspirin (CHADSvasc 1).   2 hyperlipidemia-Per primary care.  3 history of snoring-  Kirk Ruths, MD

## 2020-03-11 ENCOUNTER — Ambulatory Visit: Payer: Managed Care, Other (non HMO) | Admitting: Cardiology

## 2020-07-09 ENCOUNTER — Ambulatory Visit (INDEPENDENT_AMBULATORY_CARE_PROVIDER_SITE_OTHER): Payer: Managed Care, Other (non HMO) | Admitting: Otolaryngology

## 2020-08-11 ENCOUNTER — Other Ambulatory Visit: Payer: Self-pay | Admitting: Cardiology

## 2020-08-11 DIAGNOSIS — R002 Palpitations: Secondary | ICD-10-CM

## 2020-11-11 IMAGING — MG DIGITAL SCREENING BILAT W/ TOMO W/ CAD
8 series · 8 of 24 positions shown · non-contrast
Comparison: Previous exam(s).

CLINICAL DATA: Screening.

EXAM:
DIGITAL SCREENING BILATERAL MAMMOGRAM WITH TOMO AND CAD

[R CC synth-2D]
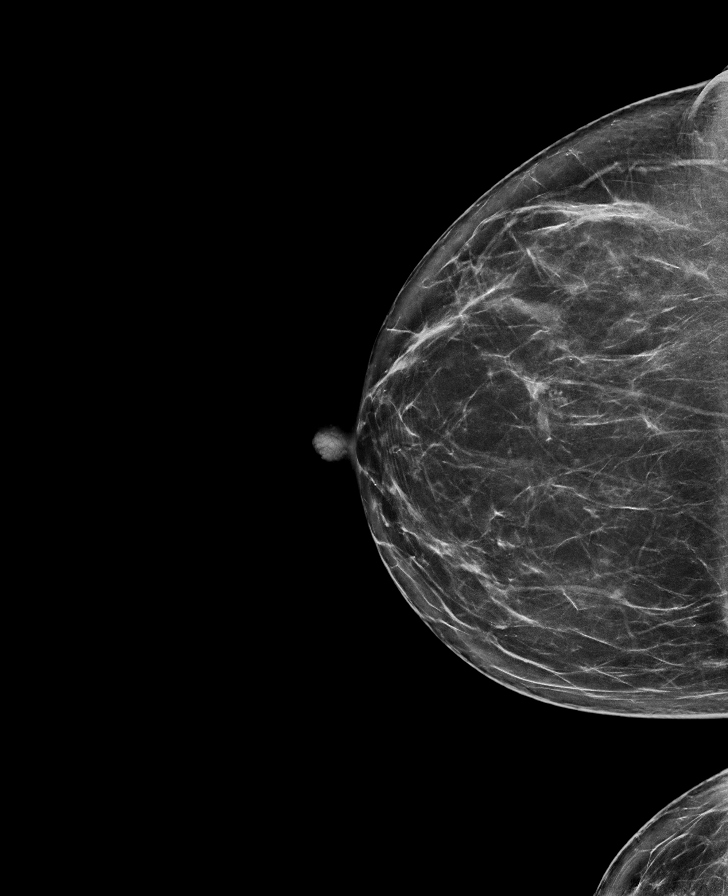

[L CC synth-2D]
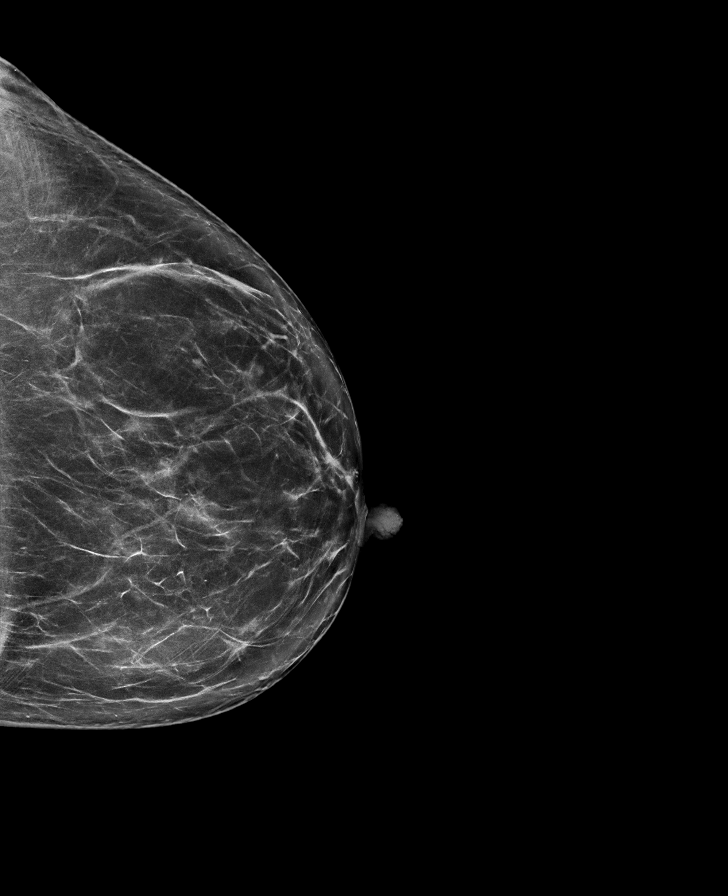

[R MLO synth-2D]
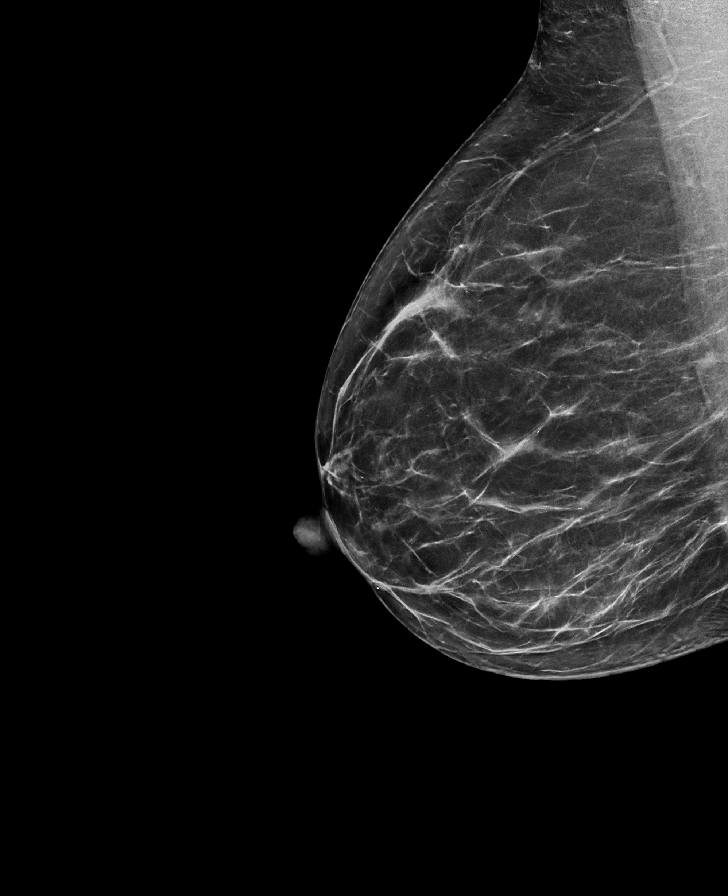

[L MLO synth-2D]
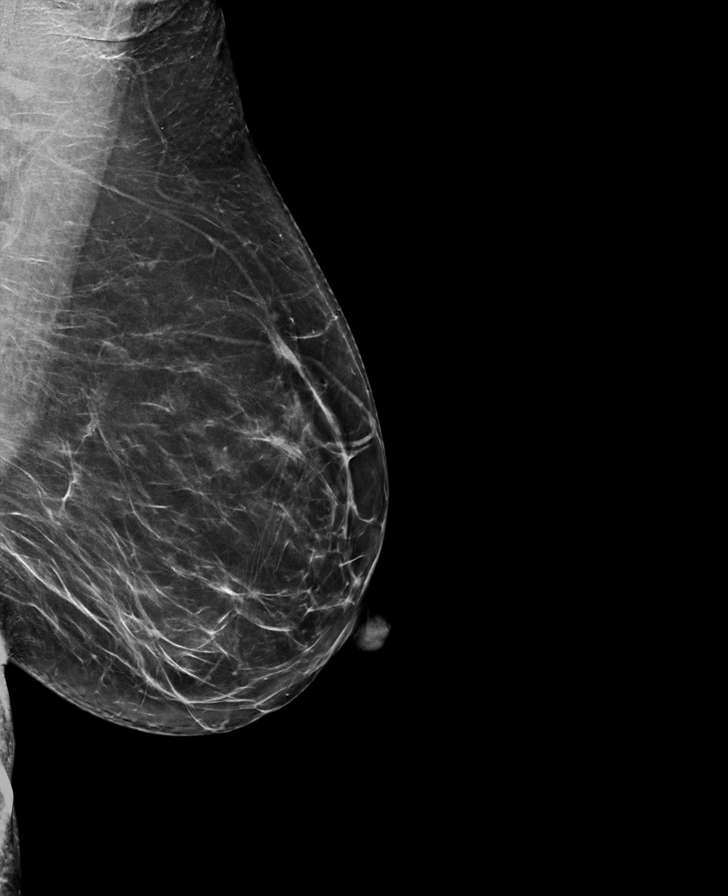

[R CC tomo · tomo slice 39/77.0]
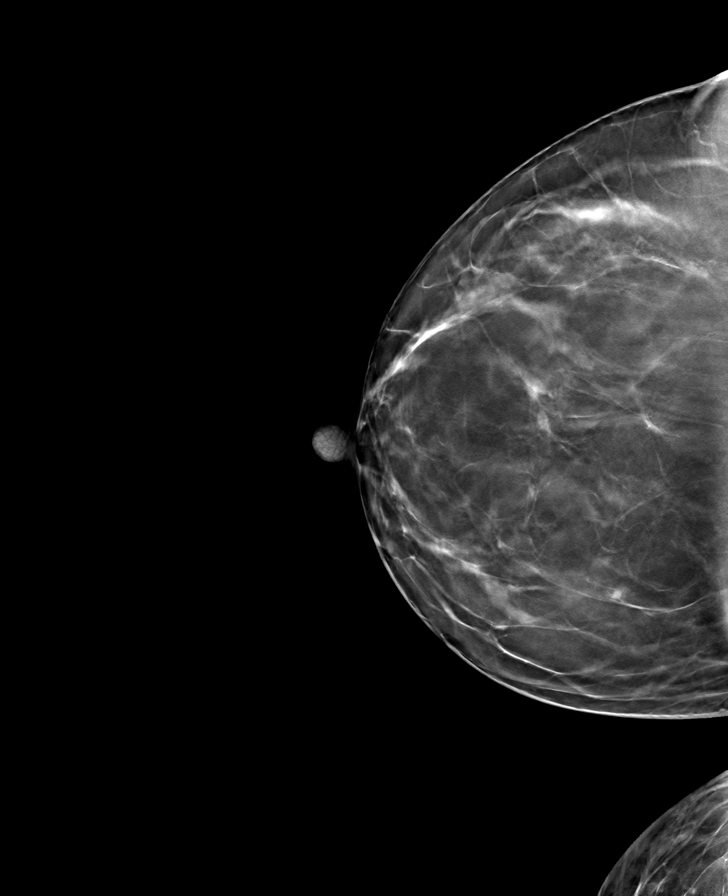

[L MLO tomo · tomo slice 42/83.0]
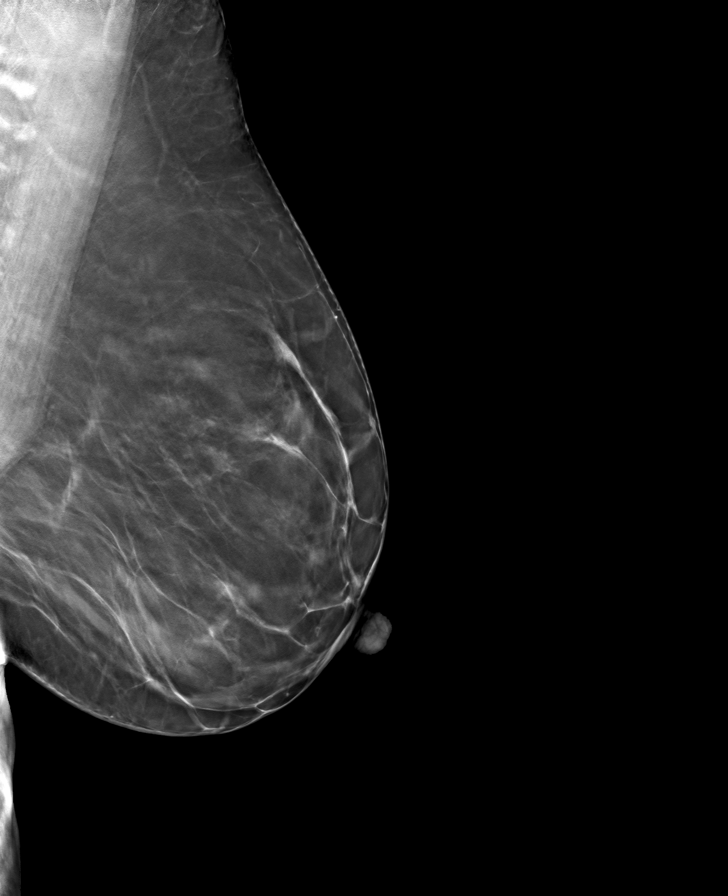

[L CC tomo · tomo slice 39/77.0]
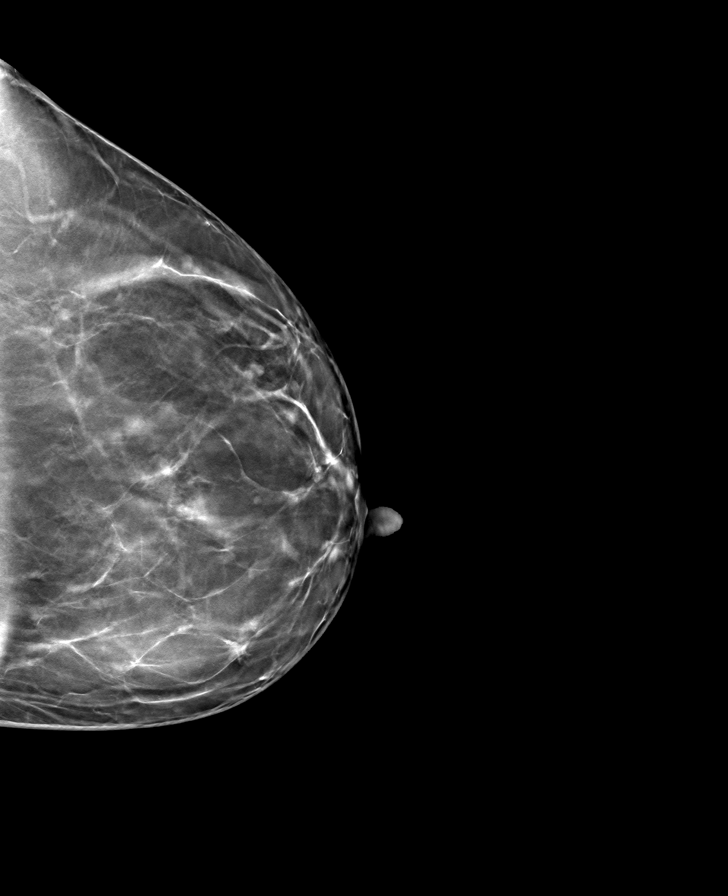

[R MLO tomo · tomo slice 39/78.0]
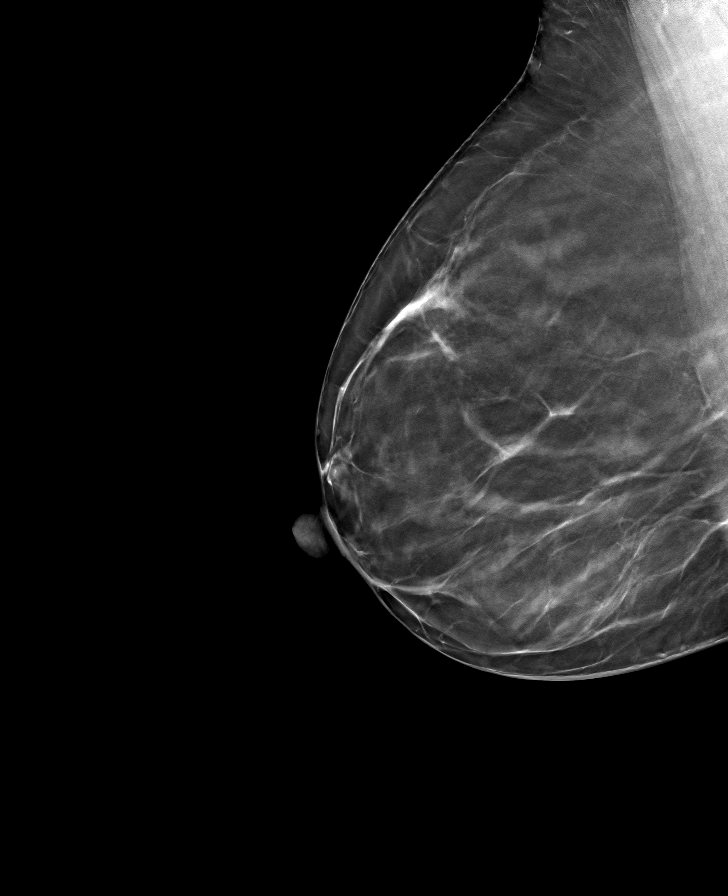

[8 of 24 positions shown; findings below may reference images not displayed]

ACR Breast Density Category b: There are scattered areas of
fibroglandular density.
FINDINGS: There are no findings suspicious for malignancy. Images were
processed with CAD.
IMPRESSION: No mammographic evidence of malignancy. A result letter of this
screening mammogram will be mailed directly to the patient.

RECOMMENDATION:
Screening mammogram in one year. (Code:[id number])

BI-RADS CATEGORY  1: Negative.

## 2021-04-04 ENCOUNTER — Other Ambulatory Visit: Payer: Self-pay | Admitting: Physician Assistant

## 2021-04-04 DIAGNOSIS — R1084 Generalized abdominal pain: Secondary | ICD-10-CM

## 2021-04-04 DIAGNOSIS — K5900 Constipation, unspecified: Secondary | ICD-10-CM

## 2021-04-05 ENCOUNTER — Ambulatory Visit
Admission: RE | Admit: 2021-04-05 | Discharge: 2021-04-05 | Disposition: A | Discharge status: Home / Self Care | Payer: Managed Care, Other (non HMO) | Source: Ambulatory Visit | Attending: Physician Assistant | Admitting: Physician Assistant

## 2021-04-05 DIAGNOSIS — R1084 Generalized abdominal pain: Secondary | ICD-10-CM

## 2021-04-05 DIAGNOSIS — K5900 Constipation, unspecified: Secondary | ICD-10-CM

## 2021-06-07 ENCOUNTER — Other Ambulatory Visit: Payer: Self-pay | Admitting: Physician Assistant

## 2021-06-07 DIAGNOSIS — R1084 Generalized abdominal pain: Secondary | ICD-10-CM

## 2021-06-08 ENCOUNTER — Other Ambulatory Visit: Payer: Managed Care, Other (non HMO)

## 2021-06-09 ENCOUNTER — Ambulatory Visit
Admission: RE | Admit: 2021-06-09 | Discharge: 2021-06-09 | Disposition: A | Discharge status: Home / Self Care | Payer: Managed Care, Other (non HMO) | Source: Ambulatory Visit | Attending: Physician Assistant | Admitting: Physician Assistant

## 2021-06-09 ENCOUNTER — Other Ambulatory Visit: Payer: Self-pay

## 2021-06-09 DIAGNOSIS — R1084 Generalized abdominal pain: Secondary | ICD-10-CM

## 2021-06-09 MED ORDER — IOPAMIDOL (ISOVUE-300) INJECTION 61%
100.0000 mL | Freq: Once | INTRAVENOUS | Status: AC | PRN
  Administered 2021-06-09: 100 mL via INTRAVENOUS

## 2021-11-17 ENCOUNTER — Other Ambulatory Visit: Payer: Self-pay | Admitting: Cardiology

## 2021-11-17 DIAGNOSIS — R002 Palpitations: Secondary | ICD-10-CM

## 2021-12-06 ENCOUNTER — Telehealth: Payer: Self-pay | Admitting: Cardiology

## 2021-12-06 NOTE — Telephone Encounter (Signed)
*  STAT* If patient is at the pharmacy, call can be transferred to refill team.   1. Which medications need to be refilled? (please list name of each medication and dose if known) Metoprolol  2. Which pharmacy/location (including street and city if local pharmacy) is medication to be sent to?[facility] [place]  3. Do they need a 30 day or 90 day supply? Enough until her appointment on 12-22-21- please call today- completely out of it

## 2021-12-07 ENCOUNTER — Telehealth: Payer: Self-pay | Admitting: Cardiology

## 2021-12-07 ENCOUNTER — Other Ambulatory Visit: Payer: Self-pay

## 2021-12-07 DIAGNOSIS — R002 Palpitations: Secondary | ICD-10-CM

## 2021-12-07 MED ORDER — METOPROLOL SUCCINATE ER 25 MG PO TB24: 25.0000 mg | ORAL_TABLET | Freq: Every day | ORAL | 0 refills | Status: DC | PRN

## 2021-12-07 NOTE — Telephone Encounter (Signed)
Pt returning call to nurse. Please advise

## 2021-12-07 NOTE — Telephone Encounter (Signed)
Called pharmacy to see if the refill went thru 11/21/2021 and they stated that it did not. I resent the refill and informed the patient to call her pharmacy to check on the status and to call us back if she has any concerns. Patient verbalized understanding.

## 2021-12-07 NOTE — Telephone Encounter (Signed)
Patient states she needs refill of her metoprolol succinate until her appointment on 8/3 with Laurann Montana, NP. Advised patient that refill for 14 days has been sent to patient preferred pharmacy to get her through until appointment but advised her that she will need to keep appointment for further refills. Patient verbalized understanding.

## 2021-12-21 NOTE — Progress Notes (Unsigned)
Office Visit    Patient Name: Sherry Kline Date of Encounter: 12/22/2021  PCP:  Bernerd Limbo, MD   [place]  Cardiologist:  Kirk Ruths, MD  Advanced Practice Provider:  No care team member to display Electrophysiologist:  Thompson Grayer, MD      Chief Complaint    Sherry Kline is a 56 y.o. female presents today for palpitations   Past Medical History    Past Medical History:  Diagnosis Date   Anxiety    High cholesterol    Past Surgical History:  Procedure Laterality Date   TUBAL LIGATION      Allergies  Allergies  Allergen Reactions   Atorvastatin Calcium    Rosuvastatin     Joint pain    History of Present Illness    Sherry Kline is a 56 y.o. female with a hx of palpitations, atrial flutter, hyperlipidemia last seen 09/01/19 by Dr. Rayann Heman.  Echo 06/2019 normal LVEF, no significant valvular abnormalities. Monitor 2021 with NSR with PAC, PVC and paroxysmal atrial flutter. TSH was unremarkable.   Last seen 08/2019 by Dr. Rayann Heman and was recommended for atrial flutter ablation as she had failed beta blocker. She elected not to proceed.  Presents today for follow-up independently. Reports no shortness of breath nor dyspnea on exertion. Reports no chest pain, pressure, or tightness. No edema, orthopnea, PND. Reports occasional palpitations which are short and overall not bothersome. She does snore. Tells me she used to see an ENT and was told her snoring was coming from her uvula being long. She does not wish to pursue sleep study at this time  EKGs/Labs/Other Studies Reviewed:   The following studies were reviewed today:  EKG:  EKG is  ordered today.  The ekg ordered today demonstrates normal sinus and 62 bpm with new right bundle branch block.  Recent Labs: No results found for requested labs within last 365 days.  Recent Lipid Panel    Component Value Date/Time   CHOL (H) 02/21/2007 0325    256        ATP III  CLASSIFICATION:  <200     mg/dL   Desirable  200-239  mg/dL   Borderline High  >=240    mg/dL   High   TRIG 136 02/21/2007 0325   HDL 27 (L) 02/21/2007 0325   CHOLHDL 9.5 02/21/2007 0325   VLDL 27 02/21/2007 0325   LDLCALC (H) 02/21/2007 0325    202        Total Cholesterol/HDL:CHD Risk Coronary Heart Disease Risk Table                     Men   Women  1/2 Average Risk   3.4   3.3     Home Medications   Current Meds  Medication Sig   ALPRAZolam (XANAX) 0.25 MG tablet Take 0.25 mg by mouth daily as needed.   aspirin EC 81 MG tablet Take 1 tablet (81 mg total) by mouth daily.   metoprolol succinate (TOPROL-XL) 25 MG 24 hr tablet Take 1 tablet (25 mg total) by mouth daily as needed (for palpitations).     Review of Systems      All other systems reviewed and are otherwise negative except as noted above.  Physical Exam    VS:  BP 108/82 (BP Location: Left Arm, Patient Position: Sitting, Cuff Size: Normal)   Pulse 62   Ht '5\' 7"'$  (1.702 m)  Wt 167 lb 1.6 oz (75.8 kg)   BMI 26.17 kg/m  , BMI Body mass index is 26.17 kg/m.  Wt Readings from Last 3 Encounters:  12/22/21 167 lb 1.6 oz (75.8 kg)  09/16/19 160 lb (72.6 kg)  09/01/19 165 lb (74.8 kg)     GEN: Well nourished, well developed, in no acute distress. HEENT: normal. Neck: Supple, no JVD, carotid bruits, or masses. Cardiac: RRR, no murmurs, rubs, or gallops. No clubbing, cyanosis, edema.  Radials/PT 2+ and equal bilaterally.  Respiratory:  Respirations regular and unlabored, clear to auscultation bilaterally. GI: Soft, nontender, nondistended. MS: No deformity or atrophy. Skin: Warm and dry, no rash. Neuro:  Strength and sensation are intact. Psych: Normal affect.  Assessment & Plan    Palpitations/paroxysmal atrial flutter- Overall well controlled on Toprol '25mg'$  QD. She is interested in trying as needed instead.  She will take Toprol 25 mg as needed for palpitations.  If she has bothersome palpitations and is  taking regularly we discussed that she could return to daily use.  Encouraged to stay well-hydrated, avoid caffeine, manage stress well.  She is not interested in atrial flutter ablation at this time as symptoms are overall not bothersome.  No indication for [place] as CHA2DS2-VASc of 1 (female) - continue ASA.   Snores - Snores but wakes feeling mostly well rested. She prefers to defer sleep study at this time.   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 Intolerance to Atorvastatin and rosuvastatin with myalgias.  Discussed return to such as Zetia, next visit, PCSK9.  Plan to update direct LDL, CMP, lipoprotein (a).  RBBB -new finding by EKG today.  No dyspnea nor chest pain concerning for angina.  No lightheadedness, dizziness, near ischemia, syncope.  Due to family history of cardiovascular disease as well as hyperlipidemia we will plan for coronary calcium score.   Disposition: Follow up in 6 month(s) with Loel Dubonnet, NP   Signed, Loel Dubonnet, NP 12/22/2021, 8:31 AM [place]

## 2021-12-22 ENCOUNTER — Ambulatory Visit (HOSPITAL_BASED_OUTPATIENT_CLINIC_OR_DEPARTMENT_OTHER): Payer: Managed Care, Other (non HMO) | Admitting: Family

## 2021-12-22 ENCOUNTER — Encounter (HOSPITAL_BASED_OUTPATIENT_CLINIC_OR_DEPARTMENT_OTHER): Payer: Self-pay | Admitting: Family

## 2021-12-22 VITALS — BP 108/82 | HR 62 | Ht 67.0 in | Wt 167.1 lb

## 2021-12-22 DIAGNOSIS — R002 Palpitations: Secondary | ICD-10-CM

## 2021-12-22 DIAGNOSIS — R0683 Snoring: Secondary | ICD-10-CM | POA: Diagnosis not present

## 2021-12-22 DIAGNOSIS — I4892 Unspecified atrial flutter: Secondary | ICD-10-CM | POA: Diagnosis not present

## 2021-12-22 DIAGNOSIS — E7849 Other hyperlipidemia: Secondary | ICD-10-CM

## 2021-12-22 DIAGNOSIS — I4510 Unspecified right bundle-branch block: Secondary | ICD-10-CM

## 2021-12-22 DIAGNOSIS — Z8249 Family history of ischemic heart disease and other diseases of the circulatory system: Secondary | ICD-10-CM

## 2021-12-22 MED ORDER — METOPROLOL SUCCINATE ER 25 MG PO TB24: 25.0000 mg | ORAL_TABLET | Freq: Every day | ORAL | 3 refills | Status: DC | PRN

## 2021-12-22 NOTE — Patient Instructions (Addendum)
Medication Instructions:   No Changes  *If you need a refill on your cardiac medications before your next appointment, please call your pharmacy*   Lab Work: Your physician recommends that you return for lab work within 30 days of scheduled Calcium Score CT.  If you have labs (blood work) drawn today and your tests are completely normal, you will receive your results only by: [place] (if you have MyChart) OR A paper copy in the mail If you have any lab test that is abnormal or we need to change your treatment, we will call you to review the results.   Testing/Procedures:  Calcium Score   Follow-Up: At [facility], you and your health needs are our priority.  As part of our continuing mission to provide you with exceptional heart care, we have created designated Provider Care Teams.  These Care Teams include your primary Cardiologist (physician) and Advanced Practice Providers (APPs -  Physician Assistants and Nurse Practitioners) who all work together to provide you with the care you need, when you need it.  We recommend signing up for the patient portal called "MyChart".  Sign up information is provided on this After Visit Summary.  MyChart is used to connect with patients for Virtual Visits (Telemedicine).  Patients are able to view lab/test results, encounter notes, upcoming appointments, etc.  Non-urgent messages can be sent to your provider as well.   To learn more about what you can do with MyChart, go to [email or web address].    Your next appointment:   6 month(s)  The format for your next appointment:   In Person  Provider:   Laurann Montana, NP    Other Instructions Please return for Lab work. You may come to the...   [facility] (3rd floor) [address], [place], [place] [address]  Open: 8am-Noon and 1pm-4:30pm  Please ring the doorbell on the small table when you exit the elevator and the Lab Tech will come get you  [place] [facility] [address] [place], [place], [place] [address] Open: 8am-1pm, then 2pm-4:30pm   [place]- Please see attached locations sheet stapled to your lab work with address and hours.    Important Information About [place]

## 2021-12-23 ENCOUNTER — Telehealth (HOSPITAL_BASED_OUTPATIENT_CLINIC_OR_DEPARTMENT_OTHER): Payer: Self-pay | Admitting: Family

## 2021-12-23 NOTE — Telephone Encounter (Signed)
Left message for patient to call and discuss scheduling the Calcium scoring ordered by Laurann Montana, NP

## 2021-12-29 NOTE — Telephone Encounter (Signed)
Left message for patient to call and discuss scheduling the cardiac calcium scoring ordered by Laurann Montana, NP

## 2022-01-03 NOTE — Telephone Encounter (Signed)
Left message for patient to call and discuss scheduling the Cardiac Calcium scoring ordered by Laurann Montana, NP

## 2022-01-09 NOTE — Telephone Encounter (Signed)
Left message for patient to call and discuss rescheduling the Calcium scoring ordered by Laurann Montana, NP

## 2022-01-17 ENCOUNTER — Encounter (HOSPITAL_BASED_OUTPATIENT_CLINIC_OR_DEPARTMENT_OTHER): Payer: Self-pay | Admitting: Family

## 2022-01-17 NOTE — Telephone Encounter (Signed)
Patient has not responded to our efforts to schedule the calcium scoring ordered by Laurann Montana, NP---will mail letter requesting patient call to discuss scheduling

## 2023-02-26 ENCOUNTER — Telehealth: Payer: Self-pay | Admitting: Family

## 2023-02-26 ENCOUNTER — Other Ambulatory Visit: Payer: Self-pay | Admitting: Cardiology

## 2023-02-26 DIAGNOSIS — R002 Palpitations: Secondary | ICD-10-CM

## 2023-02-26 MED ORDER — METOPROLOL SUCCINATE ER 25 MG PO TB24: 25.0000 mg | ORAL_TABLET | Freq: Every day | ORAL | 0 refills | Status: DC | PRN

## 2023-02-26 NOTE — Telephone Encounter (Signed)
Patient states last time she picked up her medication it was 14 pills.  She states having more palpitations and wants to see if she can get a 30 amount  She is advised she needs appt to refill medication

## 2023-02-26 NOTE — Telephone Encounter (Signed)
Left message to return call 

## 2023-02-26 NOTE — Telephone Encounter (Signed)
Pt returning nurses phone call. Please advise ?

## 2023-02-26 NOTE — Telephone Encounter (Signed)
Pt c/o medication issue:  1. Name of Medication: metoprolol succinate (TOPROL-XL) 25 MG 24 hr tablet   2. How are you currently taking this medication (dosage and times per day)? As written   3. Are you having a reaction (difficulty breathing--STAT)? No   4. What is your medication issue? Pt called in asking to speak with nurse about this medication

## 2023-03-20 ENCOUNTER — Other Ambulatory Visit: Payer: Self-pay | Admitting: Cardiology

## 2023-03-20 DIAGNOSIS — R002 Palpitations: Secondary | ICD-10-CM

## 2023-05-28 ENCOUNTER — Encounter (HOSPITAL_BASED_OUTPATIENT_CLINIC_OR_DEPARTMENT_OTHER): Payer: Self-pay | Admitting: Family

## 2023-05-28 ENCOUNTER — Ambulatory Visit (HOSPITAL_BASED_OUTPATIENT_CLINIC_OR_DEPARTMENT_OTHER): Payer: Managed Care, Other (non HMO) | Admitting: Family

## 2023-05-28 VITALS — BP 118/73 | HR 66 | Ht 67.0 in | Wt 170.2 lb

## 2023-05-28 DIAGNOSIS — E782 Mixed hyperlipidemia: Secondary | ICD-10-CM

## 2023-05-28 DIAGNOSIS — R002 Palpitations: Secondary | ICD-10-CM | POA: Diagnosis not present

## 2023-05-28 DIAGNOSIS — Z8249 Family history of ischemic heart disease and other diseases of the circulatory system: Secondary | ICD-10-CM | POA: Diagnosis not present

## 2023-05-28 DIAGNOSIS — I4892 Unspecified atrial flutter: Secondary | ICD-10-CM | POA: Diagnosis not present

## 2023-05-28 MED ORDER — METOPROLOL SUCCINATE ER 25 MG PO TB24: 37.5000 mg | ORAL_TABLET | Freq: Every day | ORAL | 1 refills | Status: DC

## 2023-05-28 NOTE — Patient Instructions (Addendum)
 Medication Instructions:  INCREASE METOPROLOL  TO 25 MG 1 AND 1/2 TABLETS DAILY   *If you need a refill on your cardiac medications before your next appointment, please call your pharmacy*  Lab Work: LPa/TSH/BMET/DIRECT LDL/CBC/MAGNESIUM TODAY   If you have labs (blood work) drawn today and your tests are completely normal, you will receive your results only by: MyChart Message (if you have MyChart) OR A paper copy in the mail If you have any lab test that is abnormal or we need to change your treatment, we will call you to review the results.  Testing/Procedures: CALCIUM SCORE -THIS WILL COST $99 OUT OF POCKET   Follow-Up: At [facility], you and your health needs are our priority.  As part of our continuing mission to provide you with exceptional heart care, we have created designated Provider Care Teams.  These Care Teams include your primary Cardiologist (physician) and Advanced Practice Providers (APPs -  Physician Assistants and Nurse Practitioners) who all work together to provide you with the care you need, when you need it.  We recommend signing up for the patient portal called MyChart.  Sign up information is provided on this After Visit Summary.  MyChart is used to connect with patients for Virtual Visits (Telemedicine).  Patients are able to view lab/test results, encounter notes, upcoming appointments, etc.  Non-urgent messages can be sent to your provider as well.   To learn more about what you can do with MyChart, go to [email or web address].    Your next appointment:   3 TO 4 month(s)  Provider:   DR CRENSHAW OR CAITLIN W NP   Other Instructions WILL REACH OUT IN COUPLE WEEKS TO SEE HOW YOU ARE DOING
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
## 2023-05-29 ENCOUNTER — Telehealth (HOSPITAL_BASED_OUTPATIENT_CLINIC_OR_DEPARTMENT_OTHER): Payer: Self-pay

## 2023-05-29 ENCOUNTER — Encounter (HOSPITAL_BASED_OUTPATIENT_CLINIC_OR_DEPARTMENT_OTHER): Payer: Self-pay | Admitting: Family

## 2023-05-29 LAB — LDL CHOLESTEROL, DIRECT: LDL Direct: 281 mg/dL — ABNORMAL HIGH (ref 0–99)

## 2023-05-29 LAB — CBC
Hematocrit: 38.8 % (ref 34.0–46.6)
Hemoglobin: 12.6 g/dL (ref 11.1–15.9)
MCH: 26.6 pg (ref 26.6–33.0)
MCHC: 32.5 g/dL (ref 31.5–35.7)
MCV: 82 fL (ref 79–97)
Platelets: 375 10*3/uL (ref 150–450)
RBC: 4.73 x10E6/uL (ref 3.77–5.28)
RDW: 13.7 % (ref 11.7–15.4)
WBC: 4.6 10*3/uL (ref 3.4–10.8)

## 2023-05-29 LAB — BASIC METABOLIC PANEL
BUN/Creatinine Ratio: 15 (ref 9–23)
BUN: 14 mg/dL (ref 6–24)
CO2: 23 mmol/L (ref 20–29)
Calcium: 9.5 mg/dL (ref 8.7–10.2)
Chloride: 104 mmol/L (ref 96–106)
Creatinine, Ser: 0.96 mg/dL (ref 0.57–1.00)
Glucose: 92 mg/dL (ref 70–99)
Potassium: 4.5 mmol/L (ref 3.5–5.2)
Sodium: 141 mmol/L (ref 134–144)
eGFR: 69 mL/min/{1.73_m2} (ref >59)

## 2023-05-29 LAB — THYROID PANEL WITH TSH
Free Thyroxine Index: 2.5 (ref 1.2–4.9)
T3 Uptake Ratio: 25 % (ref 24–39)
T4, Total: 10 ug/dL (ref 4.5–12.0)
TSH: 0.983 u[IU]/mL (ref 0.450–4.500)

## 2023-05-29 LAB — LIPOPROTEIN A (LPA): Lipoprotein (a): 161.1 nmol/L — ABNORMAL HIGH (ref <75.0)

## 2023-05-29 LAB — MAGNESIUM: Magnesium: 1.9 mg/dL (ref 1.6–2.3)

## 2023-05-29 NOTE — Telephone Encounter (Signed)
-----   Message from Alver Sorrow sent at 05/29/2023  7:36 AM EST ----- CBC with no evidence of anemia nor infection.  Normal kidneys, electrolytes, thyroid. 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 Lipoprotein (a) is a type of LDL (bad cholesterol) that when elevated indicates a higher cardiovascular risk and a genetic-based high cholesterol. LDL (bad cholesterol) elevated at 161 (would like it to be less than 100).   Discussed alternatives to statins in clinic visit yesterday. Given elevated Lp(a) and increased cardiovascular risk, recommend start Nexlizet one tablet daily. This is a non-statin medication to help lower LDL. Can provide 2 weeks samples and copay card. Repeat FLP/LFT in 2 months.

## 2023-05-29 NOTE — Telephone Encounter (Signed)
 Left message to call back

## 2023-06-06 ENCOUNTER — Telehealth (HOSPITAL_BASED_OUTPATIENT_CLINIC_OR_DEPARTMENT_OTHER): Payer: Self-pay

## 2023-06-06 NOTE — Telephone Encounter (Signed)
Called and left voice message for pt to call back.

## 2023-06-06 NOTE — Telephone Encounter (Signed)
-----   Message from Alver Sorrow sent at 05/29/2023  7:36 AM EST ----- CBC with no evidence of anemia nor infection.  Normal kidneys, electrolytes, thyroid. 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 Lipoprotein (a) is a type of LDL (bad cholesterol) that when elevated indicates a higher cardiovascular risk and a genetic-based high cholesterol. LDL (bad cholesterol) elevated at 161 (would like it to be less than 100).   Discussed alternatives to statins in clinic visit yesterday. Given elevated Lp(a) and increased cardiovascular risk, recommend start Nexlizet one tablet daily. This is a non-statin medication to help lower LDL. Can provide 2 weeks samples and copay card. Repeat FLP/LFT in 2 months.

## 2023-06-13 ENCOUNTER — Encounter (HOSPITAL_BASED_OUTPATIENT_CLINIC_OR_DEPARTMENT_OTHER): Payer: Self-pay | Admitting: Family

## 2023-06-20 ENCOUNTER — Telehealth (HOSPITAL_BASED_OUTPATIENT_CLINIC_OR_DEPARTMENT_OTHER): Payer: Self-pay

## 2023-06-20 NOTE — Telephone Encounter (Signed)
-----   Message from Alver Sorrow sent at 05/29/2023  7:36 AM EST ----- CBC with no evidence of anemia nor infection.  Normal kidneys, electrolytes, thyroid. 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 Lipoprotein (a) is a type of LDL (bad cholesterol) that when elevated indicates a higher cardiovascular risk and a genetic-based high cholesterol. LDL (bad cholesterol) elevated at 161 (would like it to be less than 100).   Discussed alternatives to statins in clinic visit yesterday. Given elevated Lp(a) and increased cardiovascular risk, recommend start Nexlizet one tablet daily. This is a non-statin medication to help lower LDL. Can provide 2 weeks samples and copay card. Repeat FLP/LFT in 2 months.

## 2023-06-20 NOTE — Telephone Encounter (Signed)
Left message for patient to call back

## 2023-06-22 ENCOUNTER — Encounter: Payer: Self-pay | Admitting: *Deleted

## 2023-08-13 ENCOUNTER — Other Ambulatory Visit: Payer: Self-pay | Admitting: Family Medicine

## 2023-08-13 DIAGNOSIS — Z1231 Encounter for screening mammogram for malignant neoplasm of breast: Secondary | ICD-10-CM

## 2023-08-14 ENCOUNTER — Ambulatory Visit
Admission: RE | Admit: 2023-08-14 | Discharge: 2023-08-14 | Disposition: A | Discharge status: Home / Self Care | Source: Ambulatory Visit | Attending: Family Medicine | Admitting: Family Medicine

## 2023-08-14 DIAGNOSIS — Z1231 Encounter for screening mammogram for malignant neoplasm of breast: Secondary | ICD-10-CM

## 2023-09-11 ENCOUNTER — Other Ambulatory Visit: Payer: Self-pay | Admitting: Physician Assistant

## 2023-09-11 DIAGNOSIS — H539 Unspecified visual disturbance: Secondary | ICD-10-CM

## 2023-09-11 DIAGNOSIS — R42 Dizziness and giddiness: Secondary | ICD-10-CM

## 2023-09-11 DIAGNOSIS — R410 Disorientation, unspecified: Secondary | ICD-10-CM

## 2023-09-12 ENCOUNTER — Emergency Department (HOSPITAL_BASED_OUTPATIENT_CLINIC_OR_DEPARTMENT_OTHER)
Admission: EM | Admit: 2023-09-12 | Discharge: 2023-09-13 | Disposition: A | Discharge status: Home / Self Care | Attending: Emergency Medicine | Admitting: Emergency Medicine

## 2023-09-12 ENCOUNTER — Emergency Department (HOSPITAL_BASED_OUTPATIENT_CLINIC_OR_DEPARTMENT_OTHER)

## 2023-09-12 ENCOUNTER — Encounter (HOSPITAL_BASED_OUTPATIENT_CLINIC_OR_DEPARTMENT_OTHER): Payer: Self-pay | Admitting: Emergency Medicine

## 2023-09-12 DIAGNOSIS — R072 Precordial pain: Secondary | ICD-10-CM | POA: Insufficient documentation

## 2023-09-12 DIAGNOSIS — R0789 Other chest pain: Secondary | ICD-10-CM

## 2023-09-12 DIAGNOSIS — E860 Dehydration: Secondary | ICD-10-CM

## 2023-09-12 DIAGNOSIS — R519 Headache, unspecified: Secondary | ICD-10-CM | POA: Diagnosis not present

## 2023-09-12 DIAGNOSIS — Z7982 Long term (current) use of aspirin: Secondary | ICD-10-CM | POA: Insufficient documentation

## 2023-09-12 DIAGNOSIS — R42 Dizziness and giddiness: Secondary | ICD-10-CM

## 2023-09-12 HISTORY — DX: Unspecified atrial flutter: I48.92

## 2023-09-12 LAB — HEPATIC FUNCTION PANEL
ALT: 11 U/L (ref 0–44)
AST: 12 U/L — ABNORMAL LOW (ref 15–41)
Albumin: 4 g/dL (ref 3.5–5.0)
Alkaline Phosphatase: 90 U/L (ref 38–126)
Bilirubin, Direct: <0.1 mg/dL (ref 0.0–0.2)
Total Bilirubin: <0.2 mg/dL (ref 0.0–1.2)
Total Protein: 7.2 g/dL (ref 6.5–8.1)

## 2023-09-12 LAB — CBC WITH DIFFERENTIAL/PLATELET
Abs Immature Granulocytes: 0.01 10*3/uL (ref 0.00–0.07)
Basophils Absolute: 0 10*3/uL (ref 0.0–0.1)
Basophils Relative: 1 %
Eosinophils Absolute: 0.1 10*3/uL (ref 0.0–0.5)
Eosinophils Relative: 1 %
HCT: 38.5 % (ref 36.0–46.0)
Hemoglobin: 12.5 g/dL (ref 12.0–15.0)
Immature Granulocytes: 0 %
Lymphocytes Relative: 44 %
Lymphs Abs: 2.2 10*3/uL (ref 0.7–4.0)
MCH: 26.6 pg (ref 26.0–34.0)
MCHC: 32.5 g/dL (ref 30.0–36.0)
MCV: 81.9 fL (ref 80.0–100.0)
Monocytes Absolute: 0.4 10*3/uL (ref 0.1–1.0)
Monocytes Relative: 9 %
Neutro Abs: 2.2 10*3/uL (ref 1.7–7.7)
Neutrophils Relative %: 45 %
Platelets: 440 10*3/uL — ABNORMAL HIGH (ref 150–400)
RBC: 4.7 MIL/uL (ref 3.87–5.11)
RDW: 13.6 % (ref 11.5–15.5)
WBC: 4.9 10*3/uL (ref 4.0–10.5)
nRBC: 0 % (ref 0.0–0.2)

## 2023-09-12 LAB — BASIC METABOLIC PANEL WITH GFR
Anion gap: 13 (ref 5–15)
BUN: 12 mg/dL (ref 6–20)
CO2: 24 mmol/L (ref 22–32)
Calcium: 9.5 mg/dL (ref 8.9–10.3)
Chloride: 102 mmol/L (ref 98–111)
Creatinine, Ser: 1.17 mg/dL — ABNORMAL HIGH (ref 0.44–1.00)
GFR, Estimated: 53 mL/min — ABNORMAL LOW (ref >60)
Glucose, Bld: 102 mg/dL — ABNORMAL HIGH (ref 70–99)
Potassium: 3.9 mmol/L (ref 3.5–5.1)
Sodium: 139 mmol/L (ref 135–145)

## 2023-09-12 LAB — TROPONIN T, HIGH SENSITIVITY
Troponin T High Sensitivity: <15 ng/L (ref <19)
Troponin T High Sensitivity: <15 ng/L (ref <19)

## 2023-09-12 LAB — MAGNESIUM: Magnesium: 2 mg/dL (ref 1.7–2.4)

## 2023-09-12 LAB — LIPASE, BLOOD: Lipase: 27 U/L (ref 11–51)

## 2023-09-12 MED ORDER — SODIUM CHLORIDE 0.9 % IV BOLUS
1000.0000 mL | Freq: Once | INTRAVENOUS | Status: AC
  Administered 2023-09-12: 1000 mL via INTRAVENOUS

## 2023-09-12 MED ORDER — MECLIZINE HCL 25 MG PO TABS
50.0000 mg | ORAL_TABLET | Freq: Once | ORAL | Status: AC
  Administered 2023-09-12: 50 mg via ORAL
  Filled 2023-09-12: qty 2

## 2023-09-12 MED ORDER — PROCHLORPERAZINE EDISYLATE 10 MG/2ML IJ SOLN
10.0000 mg | Freq: Once | INTRAMUSCULAR | Status: AC
  Administered 2023-09-12: 10 mg via INTRAVENOUS
  Filled 2023-09-12: qty 2

## 2023-09-12 MED ORDER — DIPHENHYDRAMINE HCL 50 MG/ML IJ SOLN
25.0000 mg | Freq: Once | INTRAMUSCULAR | Status: AC
  Administered 2023-09-12: 25 mg via INTRAVENOUS
  Filled 2023-09-12: qty 1

## 2023-09-12 MED ORDER — IOHEXOL 350 MG/ML SOLN
75.0000 mL | Freq: Once | INTRAVENOUS | Status: AC | PRN
  Administered 2023-09-12: 75 mL via INTRAVENOUS

## 2023-09-12 NOTE — ED Provider Notes (Signed)
 [facility] Provider Note   CSN: [id number] Arrival date & time: 09/12/23  1737     History  Chief Complaint  Patient presents with   Chest Pain   Dizziness    Sherry Kline is a 58 y.o. female.  The history is provided by the patient, medical records and a relative. No language interpreter was used.  Chest Pain Pain location:  Substernal area Pain quality: aching, dull and pressure   Pain radiates to:  Does not radiate Pain severity:  Moderate Onset quality:  Sudden Duration:  4 hours Timing:  Constant Progression:  Waxing and waning Chronicity:  New Context: not trauma   Relieved by:  Nothing Worsened by:  Exertion Ineffective treatments:  None tried Associated symptoms: dizziness, fatigue, headache, nausea and vomiting   Associated symptoms: no abdominal pain, no altered mental status, no back pain, no cough, no diaphoresis, no fever, no lower extremity edema, no near-syncope, no palpitations, no shortness of breath and no weakness   Dizziness:    Severity:  Severe   Duration:  1 week   Timing:  Intermittent   Progression:  Waxing and waning Risk factors: no prior DVT/PE   Dizziness Associated symptoms: chest pain, diarrhea, headaches, nausea and vomiting   Associated symptoms: no palpitations, no shortness of breath and no weakness        Home Medications Prior to Admission medications   Medication Sig Start Date End Date Taking? Authorizing Provider  ALPRAZolam (XANAX) 0.25 MG tablet Take 0.25 mg by mouth daily as needed. 01/15/19   [provider]  aspirin  EC 81 MG tablet Take 1 tablet (81 mg total) by mouth daily. Patient not taking: Reported on 05/28/2023 07/30/19   Lenise Quince, MD  metoprolol  succinate (TOPROL -XL) 25 MG 24 hr tablet Take 1.5 tablets (37.5 mg total) by mouth daily. 05/28/23   Clearnce Curia, NP      Allergies    Atorvastatin calcium and Rosuvastatin    Review of Systems    Review of Systems  Constitutional:  Positive for fatigue. Negative for chills, diaphoresis and fever.  HENT:  Negative for congestion.   Eyes:  Negative for visual disturbance.  Respiratory:  Negative for cough, chest tightness, shortness of breath and wheezing.   Cardiovascular:  Positive for chest pain. Negative for palpitations, leg swelling and near-syncope.  Gastrointestinal:  Positive for diarrhea, nausea and vomiting. Negative for abdominal pain and constipation.  Genitourinary:  Negative for dysuria.  Musculoskeletal:  Negative for back pain, neck pain and neck stiffness.  Skin:  Negative for rash and wound.  Neurological:  Positive for dizziness and headaches. Negative for seizures, facial asymmetry, weakness and light-headedness.  Psychiatric/Behavioral:  Negative for agitation and confusion.   All other systems reviewed and are negative.   Physical Exam Updated Vital Signs BP (!) 134/91   Pulse 83   Temp (!) 97.5 F (36.4 C)   Resp 20   Ht 5\' 7"  (1.702 m)   Wt 77.1 kg   SpO2 97%   BMI 26.63 kg/m  Physical Exam Vitals and nursing note reviewed.  Constitutional:      General: She is not in acute distress.    Appearance: She is well-developed. She is not ill-appearing, toxic-appearing or diaphoretic.  HENT:     Head: Normocephalic and atraumatic.  Eyes:     Conjunctiva/sclera: Conjunctivae normal.  Cardiovascular:     Rate and Rhythm: Normal rate and regular rhythm.  Heart sounds: No murmur heard. Pulmonary:     Effort: Pulmonary effort is normal. No respiratory distress.     Breath sounds: Normal breath sounds. No rhonchi or rales.  Chest:     Chest wall: Tenderness present.  Abdominal:     Palpations: Abdomen is soft.     Tenderness: There is no abdominal tenderness.  Musculoskeletal:        General: No swelling.     Cervical back: Neck supple.     Right lower leg: No tenderness. No edema.     Left lower leg: No tenderness. No edema.  Skin:     General: Skin is warm and dry.     Capillary Refill: Capillary refill takes less than 2 seconds.     Findings: No erythema.  Neurological:     General: No focal deficit present.     Mental Status: She is alert.  Psychiatric:        Mood and Affect: Mood normal.     ED Results / Procedures / Treatments   Labs (all labs ordered are listed, but only abnormal results are displayed) Labs Reviewed  BASIC METABOLIC PANEL WITH GFR - Abnormal; Notable for the following components:      Result Value   Glucose, Bld 102 (*)    Creatinine, Ser 1.17 (*)    GFR, Estimated 53 (*)    All other components within normal limits  CBC WITH DIFFERENTIAL/PLATELET - Abnormal; Notable for the following components:   Platelets 440 (*)    All other components within normal limits  HEPATIC FUNCTION PANEL  LIPASE, BLOOD  TSH  MAGNESIUM  TROPONIN T, HIGH SENSITIVITY  TROPONIN T, HIGH SENSITIVITY    EKG EKG Interpretation Date/Time:  Wednesday September 12 2023 17:46:44 EDT Ventricular Rate:  71 PR Interval:  143 QRS Duration:  120 QT Interval:  407 QTC Calculation: 443 R Axis:   2  Text Interpretation: Sinus rhythm Consider left atrial enlargement IVCD, consider atypical RBBB Consider anterior infarct when comapred to prior, similar appearance NO STEMI Confirmed by Wynell Heath ([id number]) on 09/12/2023 7:34:15 PM  Radiology DG Chest 2 View Result Date: 09/12/2023 CLINICAL DATA:  Chest pain EXAM: CHEST - 2 VIEW COMPARISON:  April 05, 2021 FINDINGS: The heart size and mediastinal contours are within normal limits. Both lungs are clear. The visualized skeletal structures are unremarkable. IMPRESSION: No active cardiopulmonary disease. Electronically Signed   By: Fredrich Jefferson M.D.   On: 09/12/2023 18:23    Procedures Procedures    Medications Ordered in ED Medications - No data to display  ED Course/ Medical Decision Making/ A&P                                 Medical Decision Making Amount  and/or Complexity of Data Reviewed Labs: ordered. Radiology: ordered.  Risk Prescription drug management.    Sherry Kline is a 58 y.o. female with a past medical history significant for hypercholesterolemia, atrial flutter, and anxiety who presents with 1 week of waxing and waning unsteadiness and dizziness as well as several days of intermittent diarrhea nausea, vomiting and then 1 day of chest pressure.  According to patient, she saw her PCP the other day who ordered a CT head for tomorrow to help look for concerning causes of this new dizziness that she has had.  She said that today she started having pressure in her chest at about 4  PM that is central.  It does not radiate and is not pleuritic but it is somewhat exertional.  She reports no abdominal pain at this time.  She had some diarrhea and nausea yesterday but denies urinary symptoms.  Denies any leg pain or leg swelling.  Denies history of blood clots.  Denies any trauma.  She is having headache at this time and is still having some of the dizziness.  Chest discomfort has improved.  Denies any fevers, chills, congestion, cough.  Denies any urinary changes.  On exam, lungs clear.  Chest is slightly tender.  No murmur.  Good pulses in extremities.  Abdomen nontender.  Good bowel sounds.  Patient has intact sensation and strength in extremities.  Normal finger-nose-finger testing.  Symmetric smile.  Clear speech.  Pupil symmetric and reactive with normal extract movements.  No carotid bruit.  Patient does state that years ago she had some vertigo but it felt very different than this.  Clinically unstable concerned about both the chest discomfort as well as her dizziness.  Given her history of vertigo, it could be evolution of her previous vertigo so we will give a headache cocktail and fluids and some meclizine .  We will have her get CTA of the head and neck given this dizziness that could be a posterior circulation problem as her PCP  wanted to get imaging of her head as well.  We will get delta troponin and other labs for her chest discomfort.  Family was also possibly concerned about norovirus given the nausea vomiting and diarrhea, will get some fluids and reassess patient  Anticipate reassessment after workup to determine disposition.  If workup reassuring and patient is feeling better, anticipate may be stable for discharge home for outpatient follow-up and workup.  Care will be transferred oncoming team after workup is completed.         Final Clinical Impression(s) / ED Diagnoses Final diagnoses:  Dizziness  Atypical chest pain  Nonintractable headache, unspecified chronicity pattern, unspecified headache type     Clinical Impression: 1. Dizziness   2. Atypical chest pain   3. Nonintractable headache, unspecified chronicity pattern, unspecified headache type     Disposition: Care transferred oncoming team to wait for workup results and reassessment after medications.  Anticipate disposition based on findings.  If she is feeling better after headache cocktail meclizine  and fluids and workup reassuring, anticipate discharge home.  If delta troponin is positive, CT imaging is concerning, or symptoms worsen, anticipate reassessment to determine disposition.  This note was prepared with assistance of Conservation officer, historic buildings. Occasional wrong-word or sound-a-like substitutions may have occurred due to the inherent limitations of voice recognition software.      Lougenia Morrissey, Marine Sia, MD 09/12/23 [phone]

## 2023-09-12 NOTE — ED Triage Notes (Signed)
 Pt c/o "chest heaviness" in mid chest that started around 1600 today; dizziness since Friday, like room is spinning; +nausea

## 2023-09-13 MED ORDER — ONDANSETRON 4 MG PO TBDP: 4.0000 mg | ORAL_TABLET | Freq: Three times a day (TID) | ORAL | 0 refills | Status: AC | PRN

## 2023-09-13 MED ORDER — MECLIZINE HCL 12.5 MG PO TABS: 12.5000 mg | ORAL_TABLET | Freq: Three times a day (TID) | ORAL | 0 refills | Status: AC | PRN

## 2023-09-14 ENCOUNTER — Other Ambulatory Visit

## 2023-11-29 ENCOUNTER — Other Ambulatory Visit (HOSPITAL_BASED_OUTPATIENT_CLINIC_OR_DEPARTMENT_OTHER): Payer: Self-pay | Admitting: Family

## 2023-11-29 DIAGNOSIS — R002 Palpitations: Secondary | ICD-10-CM
